# Patient Record
Sex: Female | Born: 1960 | Race: White | Hispanic: No | Marital: Married | Smoking: Never smoker
Health system: Southern US, Community
[De-identification: ages and names within clinical notes are randomized; demographics above are authoritative.]

## PROBLEM LIST (undated history)

## (undated) DIAGNOSIS — R635 Abnormal weight gain: Secondary | ICD-10-CM

## (undated) HISTORY — PX: AUGMENTATION MAMMAPLASTY: SUR837

## (undated) HISTORY — DX: Abnormal weight gain: R63.5

---

## 2013-03-19 ENCOUNTER — Ambulatory Visit: Payer: Self-pay | Admitting: Internal Medicine

## 2013-03-25 ENCOUNTER — Ambulatory Visit: Payer: Self-pay | Admitting: Internal Medicine

## 2013-03-26 ENCOUNTER — Ambulatory Visit: Payer: Self-pay | Admitting: Internal Medicine

## 2013-04-07 ENCOUNTER — Encounter: Payer: Self-pay | Admitting: Internal Medicine

## 2013-04-07 ENCOUNTER — Ambulatory Visit (INDEPENDENT_AMBULATORY_CARE_PROVIDER_SITE_OTHER): Payer: 59 | Admitting: Internal Medicine

## 2013-04-07 VITALS — BP 120/70 | HR 61 | Temp 97.0°F | Resp 18 | Wt 171.0 lb

## 2013-04-07 DIAGNOSIS — Z803 Family history of malignant neoplasm of breast: Secondary | ICD-10-CM | POA: Insufficient documentation

## 2013-04-07 DIAGNOSIS — J309 Allergic rhinitis, unspecified: Secondary | ICD-10-CM

## 2013-04-07 DIAGNOSIS — N951 Menopausal and female climacteric states: Secondary | ICD-10-CM

## 2013-04-07 DIAGNOSIS — Z780 Asymptomatic menopausal state: Secondary | ICD-10-CM | POA: Insufficient documentation

## 2013-04-07 DIAGNOSIS — Z139 Encounter for screening, unspecified: Secondary | ICD-10-CM

## 2013-04-07 NOTE — Progress Notes (Signed)
Subjective:    Patient ID: Katrina Taylor, female    DOB: 1961-02-16, 52 y.o.   MRN: [id number]  HPI  New pt here for first visit.  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    She formerly lived in [place].  She is newly married,   Has one 14 yo son.    PMH of allergic rhinitis.    She reports she is having daily hot flushes and wakes up multiple times during the night with flushes.  Her LMP was at age 52,  Tells me her mother stopped her menses at age 51 and her MGM stopped her menses at age 47.  She was never told that she may have premature menopause.    She had some very mild night sweats up to age 5 but much worse now waking her and daytime symptoms interfering with work  No weight loss no palpitations.  PH and FH  No gyn cancers.  Maunt had breast cancer,  No CVA,  No MI,  No personal or FH of DVT or PE  She is due for he r mm   No Known Allergies Past Medical History  Diagnosis Date  . Weight gain    Past Surgical History  Procedure Laterality Date  . Cesarean section     History   Social History  . Marital Status: Married    Spouse Name: N/A    Number of Children: N/A  . Years of Education: N/A   Occupational History  . Not on file.   Social History Main Topics  . Smoking status: Never Smoker   . Smokeless tobacco: Never Used  . Alcohol Use: No     Comment: ocassionaly  . Drug Use: No  . Sexual Activity: Yes    Birth Control/ Protection: None   Other Topics Concern  . Not on file   Social History Narrative  . No narrative on file   Family History  Problem Relation Age of Onset  . Cancer Mother 92    lung ca  . Hyperlipidemia Mother   . Alzheimer's disease Maternal Grandmother   . Hypertension Maternal Grandfather   . Cancer Paternal Grandfather     colon   Patient Active Problem List   Diagnosis Date Noted  . Menopause 04/07/2013  . Allergic rhinitis 04/07/2013  . Family history of breast cancer in female 04/07/2013    No current outpatient prescriptions on file prior to visit.   No current facility-administered medications on file prior to visit.      Review of Systems See HPI    Objective:   Physical Exam  Physical Exam  Nursing note and vitals reviewed.  Constitutional: She is oriented to person, place, and time. She appears well-developed and well-nourished.  HENT:  Head: Normocephalic and atraumatic.  Cardiovascular: Normal rate and regular rhythm. Exam reveals no gallop and no friction rub.  No murmur heard.  Pulmonary/Chest: Breath sounds normal. She has no wheezes. She has no rales.  Neurological: She is alert and oriented to person, place, and time.  Skin: Skin is warm and dry.  Psychiatric: She has a normal mood and affect. Her behavior is normal.         Assessment & Plan:  Vasomotor flushes  Will check free thyroid levels FSH.   [name] had premature menopause at age 22.  Counseled in great detail rgarding [facility] study and risk/benefit sheet given to pt.   Will get 3D mm and check all labs.  She is to see me next week to discuss  HT versus non HT options  Allergic rhinitis.  OTC Zyrted.    See me in one week

## 2013-04-07 NOTE — Patient Instructions (Addendum)
Get Chillow  Cooling pillow  Frogg Togg cooling towel

## 2013-04-08 LAB — LIPID PANEL
Cholesterol: 249 mg/dL — ABNORMAL HIGH (ref 0–200)
HDL: 83 mg/dL (ref >39)
LDL Cholesterol: 150 mg/dL — ABNORMAL HIGH (ref 0–99)
Triglycerides: 78 mg/dL (ref <150)

## 2013-04-08 LAB — COMPREHENSIVE METABOLIC PANEL
Albumin: 4.5 g/dL (ref 3.5–5.2)
Alkaline Phosphatase: 104 U/L (ref 39–117)
BUN: 20 mg/dL (ref 6–23)
CO2: 26 mEq/L (ref 19–32)
Calcium: 10.4 mg/dL (ref 8.4–10.5)
Glucose, Bld: 76 mg/dL (ref 70–99)
Potassium: 4.2 mEq/L (ref 3.5–5.3)

## 2013-04-08 LAB — CBC WITH DIFFERENTIAL/PLATELET
Basophils Relative: 1 % (ref 0–1)
HCT: 40.3 % (ref 36.0–46.0)
Hemoglobin: 13.6 g/dL (ref 12.0–15.0)
MCH: 31.3 pg (ref 26.0–34.0)
MCHC: 33.7 g/dL (ref 30.0–36.0)
Monocytes Absolute: 0.5 10*3/uL (ref 0.1–1.0)
Monocytes Relative: 7 % (ref 3–12)
Neutro Abs: 3.6 10*3/uL (ref 1.7–7.7)

## 2013-04-09 LAB — VITAMIN D 25 HYDROXY (VIT D DEFICIENCY, FRACTURES): Vit D, 25-Hydroxy: 33 ng/mL (ref 30–89)

## 2013-04-09 LAB — FOLLICLE STIMULATING HORMONE: FSH: 73.2 m[IU]/mL

## 2013-04-11 ENCOUNTER — Encounter: Payer: Self-pay | Admitting: Internal Medicine

## 2013-04-13 ENCOUNTER — Encounter: Payer: Self-pay | Admitting: *Deleted

## 2013-04-16 ENCOUNTER — Other Ambulatory Visit: Payer: Self-pay | Admitting: Internal Medicine

## 2013-04-16 ENCOUNTER — Ambulatory Visit: Admission: RE | Admit: 2013-04-16 | Payer: 59 | Source: Ambulatory Visit

## 2013-04-16 ENCOUNTER — Ambulatory Visit
Admission: RE | Admit: 2013-04-16 | Discharge: 2013-04-16 | Disposition: A | Discharge status: Home / Self Care | Payer: 59 | Source: Ambulatory Visit | Attending: Internal Medicine | Admitting: Internal Medicine

## 2013-04-16 DIAGNOSIS — Z139 Encounter for screening, unspecified: Secondary | ICD-10-CM

## 2013-04-16 DIAGNOSIS — Z1231 Encounter for screening mammogram for malignant neoplasm of breast: Secondary | ICD-10-CM

## 2013-04-28 ENCOUNTER — Telehealth: Payer: Self-pay | Admitting: *Deleted

## 2013-04-28 NOTE — Telephone Encounter (Signed)
Pt has had all labs and imaging completed pt is wanting to proceed with the minivelle.

## 2013-04-29 ENCOUNTER — Telehealth: Payer: Self-pay | Admitting: *Deleted

## 2013-04-29 ENCOUNTER — Other Ambulatory Visit: Payer: Self-pay | Admitting: Internal Medicine

## 2013-04-29 MED ORDER — ESTRADIOL 0.05 MG/24HR TD PTTW: MEDICATED_PATCH | TRANSDERMAL | Status: DC

## 2013-04-29 MED ORDER — PROGESTERONE MICRONIZED 100 MG PO CAPS: 100.0000 mg | ORAL_CAPSULE | Freq: Every day | ORAL | Status: DC

## 2013-04-29 NOTE — Telephone Encounter (Signed)
Notified pt that medication is at [facility] out pt pharmacy

## 2013-06-25 ENCOUNTER — Other Ambulatory Visit: Payer: Self-pay

## 2013-06-26 ENCOUNTER — Other Ambulatory Visit: Payer: Self-pay | Admitting: Internal Medicine

## 2013-06-26 NOTE — Telephone Encounter (Signed)
Refill request last MM 03/2013

## 2013-06-27 ENCOUNTER — Encounter: Payer: Self-pay | Admitting: Internal Medicine

## 2013-06-27 DIAGNOSIS — N951 Menopausal and female climacteric states: Secondary | ICD-10-CM | POA: Insufficient documentation

## 2013-06-27 NOTE — Telephone Encounter (Signed)
Katrina Taylor  Call pt and give her a 15 min appt to see me for follow up after starting her HT  Can see her before the end of the year.  I refilled 2 month of her minivelle so she has enough time to get to office  When you speak with her confirm that she is also taking her prometrium daily

## 2013-06-29 ENCOUNTER — Telehealth: Payer: Self-pay | Admitting: *Deleted

## 2013-06-29 NOTE — Telephone Encounter (Signed)
appt made for 12 /03  She is still taking the prometrium daily

## 2013-06-29 NOTE — Telephone Encounter (Signed)
error 

## 2013-07-02 ENCOUNTER — Other Ambulatory Visit: Payer: Self-pay | Admitting: Internal Medicine

## 2013-07-02 NOTE — Telephone Encounter (Signed)
Refill request MM 03/2013

## 2013-07-22 ENCOUNTER — Ambulatory Visit (INDEPENDENT_AMBULATORY_CARE_PROVIDER_SITE_OTHER): Payer: 59 | Admitting: Internal Medicine

## 2013-07-22 ENCOUNTER — Encounter: Payer: Self-pay | Admitting: Internal Medicine

## 2013-07-22 VITALS — BP 105/71 | HR 59 | Temp 97.4°F | Resp 18 | Wt 168.0 lb

## 2013-07-22 DIAGNOSIS — Z780 Asymptomatic menopausal state: Secondary | ICD-10-CM

## 2013-07-22 DIAGNOSIS — N951 Menopausal and female climacteric states: Secondary | ICD-10-CM

## 2013-07-22 DIAGNOSIS — S39012A Strain of muscle, fascia and tendon of lower back, initial encounter: Secondary | ICD-10-CM

## 2013-07-22 DIAGNOSIS — E785 Hyperlipidemia, unspecified: Secondary | ICD-10-CM

## 2013-07-22 DIAGNOSIS — S335XXA Sprain of ligaments of lumbar spine, initial encounter: Secondary | ICD-10-CM

## 2013-07-22 NOTE — Patient Instructions (Signed)
Schedule CPE

## 2013-07-22 NOTE — Progress Notes (Signed)
Subjective:    Patient ID: Katrina Taylor, female    DOB: March 24, 1961, 52 y.o.   MRN: [id number]  HPI Katrina Taylor is here for follow up after initiation of HT for bothersome menopausal flushes.  Feels much better no flushes on current dose of HT.  She denies vaginal spotting since initiation  See lipids  Katrina Taylor does not want any RX meds .  She has signed up for the [facility] program and has lost weight.    1 week ago while bending forward had acute L sided back pain when she tried to stand up.  Took ADvil intermittantly,  Had some pain in both thighs  (no swelling).  She tells me she is 100% better today.  No Known Allergies Past Medical History  Diagnosis Date  . Weight gain    Past Surgical History  Procedure Laterality Date  . Cesarean section     History   Social History  . Marital Status: Married    Spouse Name: N/A    Number of Children: N/A  . Years of Education: N/A   Occupational History  . Not on file.   Social History Main Topics  . Smoking status: Never Smoker   . Smokeless tobacco: Never Used  . Alcohol Use: No     Comment: ocassionaly  . Drug Use: No  . Sexual Activity: Yes    Birth Control/ Protection: None   Other Topics Concern  . Not on file   Social History Narrative  . No narrative on file   Family History  Problem Relation Age of Onset  . Cancer Mother 28    lung ca  . Hyperlipidemia Mother   . Alzheimer's disease Maternal Grandmother   . Hypertension Maternal Grandfather   . Cancer Paternal Grandfather     colon   Patient Active Problem List   Diagnosis Date Noted  . Menopausal hot flushes 06/27/2013  . Menopause 04/07/2013  . Allergic rhinitis 04/07/2013  . Family history of breast cancer in female 04/07/2013   Current Outpatient Prescriptions on File Prior to Visit  Medication Sig Dispense Refill  . cholecalciferol (VITAMIN D) 1000 UNITS tablet Take 1,000 Units by mouth daily.      . fish oil-omega-3 fatty acids 1000 MG  capsule Take 2 g by mouth daily.      Marland Kitchen MINIVELLE 0.05 MG/24HR patch PLACE ONE PATCH ON SKIN TWO TIMES WEEKLY  8 patch  1  . MULTIPLE MINERALS-VITAMINS PO Take 1 tablet by mouth daily.      . progesterone (PROMETRIUM) 100 MG capsule TAKE 1 CAPSULE BY MOUTH ONCE DAILY  90 capsule  0   No current facility-administered medications on file prior to visit.       Review of Systems    see HPI Objective:   Physical Exam  Physical Exam  Nursing note and vitals reviewed.  Constitutional: She is oriented to person, place, and time. She appears well-developed and well-nourished.  HENT:  Head: Normocephalic and atraumatic.  Cardiovascular: Normal rate and regular rhythm. Exam reveals no gallop and no friction rub.  No murmur heard.  Pulmonary/Chest: Breath sounds normal. She has no wheezes. She has no rales.  Neurological: She is alert and oriented to person, place, and time. LE  Reflexes 2+ symmetric   SLR neg bilaterally Motor 5/5 LE muscle groups bilaterally  Skin: Skin is warm and dry.  Psychiatric: She has a normal mood and affect. Her behavior is normal.  Assessment & Plan:  Symptomatic menopause  Will continue HT  Advised to notify me if any vaginal spotting or leg swelling.  We discussed trying a lower dose of estrogen when her next refill is due.  She wishes to stay on the same dose for now  L/S strain    Improved now  On no meds  Hyperlipidemia  Gave copy of dash diet .  She declines any medication  Will recheck fasting levels at CPE  Schedule CPe

## 2013-09-01 ENCOUNTER — Encounter: Payer: Self-pay | Admitting: Internal Medicine

## 2013-09-03 ENCOUNTER — Other Ambulatory Visit: Payer: Self-pay | Admitting: *Deleted

## 2013-09-03 MED ORDER — ESTRADIOL 0.05 MG/24HR TD PTTW: MEDICATED_PATCH | TRANSDERMAL | Status: DC

## 2013-09-03 MED ORDER — PROGESTERONE MICRONIZED 100 MG PO CAPS: ORAL_CAPSULE | ORAL | Status: DC

## 2013-09-03 NOTE — Telephone Encounter (Signed)
Refill request pt wants a 90 day supply

## 2013-09-22 ENCOUNTER — Encounter: Payer: 59 | Admitting: Internal Medicine

## 2013-11-05 ENCOUNTER — Other Ambulatory Visit: Payer: Self-pay | Admitting: *Deleted

## 2013-11-05 DIAGNOSIS — E785 Hyperlipidemia, unspecified: Secondary | ICD-10-CM

## 2013-11-10 ENCOUNTER — Encounter: Payer: 59 | Admitting: Internal Medicine

## 2013-12-08 ENCOUNTER — Other Ambulatory Visit: Payer: Self-pay | Admitting: *Deleted

## 2013-12-08 ENCOUNTER — Encounter: Payer: Self-pay | Admitting: Internal Medicine

## 2013-12-08 NOTE — Telephone Encounter (Signed)
Refill request wants 3 month supply

## 2013-12-09 MED ORDER — PROGESTERONE MICRONIZED 100 MG PO CAPS: ORAL_CAPSULE | ORAL | Status: DC

## 2013-12-09 MED ORDER — ESTRADIOL 0.05 MG/24HR TD PTTW: MEDICATED_PATCH | TRANSDERMAL | Status: DC

## 2013-12-11 ENCOUNTER — Encounter: Payer: Self-pay | Admitting: Internal Medicine

## 2013-12-11 ENCOUNTER — Other Ambulatory Visit: Payer: Self-pay | Admitting: *Deleted

## 2014-01-17 NOTE — Progress Notes (Signed)
Subjective:    Patient ID: Katrina Taylor, female    DOB: [birth year], 53 y.o.   MRN: [id number]  HPI  Katrina Taylor is here for CPE  She is on HT for perimenopausal flushes both day and night  She states 98% improvement .  No vaginal spotting    She participated in mud run for women over the weekend.      See lipids   She has started running,  Is losing weight and watching her diet    Allergic rhinitis  She began using Flonase and Zyrtec     No Known Allergies Past Medical History  Diagnosis Date  . Weight gain    Past Surgical History  Procedure Laterality Date  . Cesarean section     History   Social History  . Marital Status: Married    Spouse Name: N/A    Number of Children: N/A  . Years of Education: N/A   Occupational History  . Not on file.   Social History Main Topics  . Smoking status: Never Smoker   . Smokeless tobacco: Never Used  . Alcohol Use: No     Comment: ocassionaly  . Drug Use: No  . Sexual Activity: Yes    Birth Control/ Protection: None   Other Topics Concern  . Not on file   Social History Narrative  . No narrative on file   Family History  Problem Relation Age of Onset  . Cancer Mother [id number]    lung ca  . Hyperlipidemia Mother   . Alzheimer's disease Maternal Grandmother   . Hypertension Maternal Grandfather   . Cancer Paternal Grandfather     colon   Patient Active Problem List   Diagnosis Date Noted  . Hyperlipidemia 07/22/2013  . Menopausal hot flushes   initiated HT 04/2013 06/27/2013  . Menopause 04/07/2013  . Allergic rhinitis 04/07/2013  . Family history of breast cancer in female 04/07/2013   Current Outpatient Prescriptions on File Prior to Visit  Medication Sig Dispense Refill  . cholecalciferol (VITAMIN D) 1000 UNITS tablet Take 1,000 Units by mouth daily.      Marland Kitchen. estradiol (MINIVELLE) 0.05 MG/24HR patch Place one patch on skin two times per week  8 patch  2  . fish oil-omega-3 fatty acids 1000 MG capsule Take 2 g by  mouth daily.      . MULTIPLE MINERALS-VITAMINS PO Take 1 tablet by mouth daily.      . progesterone (PROMETRIUM) 100 MG capsule Take one daily  90 capsule  1   No current facility-administered medications on file prior to visit.      Review of Systems  Respiratory: Negative for cough, choking, chest tightness and shortness of breath.   Cardiovascular: Negative for chest pain, palpitations and leg swelling.  Gastrointestinal: Negative for abdominal pain.       Objective:   Physical Exam Physical Exam  Vital signs and nursing note reviewed  Constitutional: She is oriented to person, place, and time. She appears well-developed and well-nourished. She is cooperative.  HENT:  Head: Normocephalic and atraumatic.  Right Ear: Tympanic membrane normal.  Left Ear: Tympanic membrane normal.  Nose: Nose normal.  Mouth/Throat: Oropharynx is clear and moist and mucous membranes are normal. No oropharyngeal exudate or posterior oropharyngeal erythema.  Eyes: Conjunctivae and EOM are normal. Pupils are equal, round, and reactive to light.  Neck: Neck supple. No JVD present. Carotid bruit is not present. No mass and no thyromegaly present.  Cardiovascular: Regular rhythm,  normal heart sounds, intact distal pulses and normal pulses.  Exam reveals no gallop and no friction rub.   No murmur heard. Pulses:      Dorsalis pedis pulses are 2+ on the right side, and 2+ on the left side.  Pulmonary/Chest: Breath sounds normal. She has no wheezes. She has no rhonchi. She has no rales. Right breast exhibits no mass, no nipple discharge and no skin change. Left breast exhibits no mass, no nipple discharge and no skin change.   She has bilateral breast implants Abdominal: Soft. Bowel sounds are normal. She exhibits no distension and no mass. There is no hepatosplenomegaly. There is no tenderness. There is no CVA tenderness.   Rectal no mass guaiac neg Genitourinary: Rectum normal, vagina normal and uterus  normal. Rectal exam shows no mass. Guaiac negative stool. No labial fusion. There is no lesion on the right labia. There is no lesion on the left labia. Cervix exhibits no motion tenderness. Right adnexum displays no mass, no tenderness and no fullness. Left adnexum displays no mass, no tenderness and no fullness. No erythema around the vagina.  Musculoskeletal:       No active synovitis to any joint.    Lymphadenopathy:       Right cervical: No superficial cervical adenopathy present.      Left cervical: No superficial cervical adenopathy present.       Right axillary: No pectoral and no lateral adenopathy present.       Left axillary: No pectoral and no lateral adenopathy present.      Right: No inguinal adenopathy present.       Left: No inguinal adenopathy present.  Neurological: She is alert and oriented to person, place, and time. She has normal strength and normal reflexes. No cranial nerve deficit or sensory deficit. She displays a negative Romberg sign. Coordination and gait normal.  Skin: Skin is warm and dry. No abrasion, no bruising, no ecchymosis and no rash noted. No cyanosis. Nails show no clubbing.  Psychiatric: She has a normal mood and affect. Her speech is normal and behavior is normal.          Assessment & Plan:  Health Maintenance:  Will schedule  Colonoscopy.  MM due in 03/2014  Counseled lung cancer screening guideline  She is a non-smoker  Pap today  Hyperlipdemia  Will rehceck today as pt is fasting  Vasomotor flushes    Much improved.    ADvised to be sure to get yearly mm.  M  aunt has breast cancer  I advised pt is she wants to taper at 32 month of use  That is OK  Allergic rhinitis  conitnue meds    See me in 6 months to one year        Assessment & Plan:

## 2014-01-18 ENCOUNTER — Ambulatory Visit (INDEPENDENT_AMBULATORY_CARE_PROVIDER_SITE_OTHER): Payer: 59 | Admitting: Internal Medicine

## 2014-01-18 ENCOUNTER — Encounter: Payer: Self-pay | Admitting: Internal Medicine

## 2014-01-18 VITALS — BP 120/73 | HR 67 | Temp 98.1°F | Resp 18 | Ht 67.5 in | Wt 163.0 lb

## 2014-01-18 DIAGNOSIS — Z1151 Encounter for screening for human papillomavirus (HPV): Secondary | ICD-10-CM

## 2014-01-18 DIAGNOSIS — E785 Hyperlipidemia, unspecified: Secondary | ICD-10-CM

## 2014-01-18 DIAGNOSIS — N951 Menopausal and female climacteric states: Secondary | ICD-10-CM

## 2014-01-18 DIAGNOSIS — Z1211 Encounter for screening for malignant neoplasm of colon: Secondary | ICD-10-CM

## 2014-01-18 DIAGNOSIS — Z0000 Encounter for general adult medical examination without abnormal findings: Secondary | ICD-10-CM

## 2014-01-18 DIAGNOSIS — J309 Allergic rhinitis, unspecified: Secondary | ICD-10-CM

## 2014-01-18 DIAGNOSIS — Z124 Encounter for screening for malignant neoplasm of cervix: Secondary | ICD-10-CM

## 2014-01-18 LAB — POCT URINALYSIS DIPSTICK
BILIRUBIN UA: NEGATIVE
Blood, UA: NEGATIVE
Glucose, UA: NEGATIVE
KETONES UA: NEGATIVE
Leukocytes, UA: NEGATIVE
Nitrite, UA: NEGATIVE
PROTEIN UA: NEGATIVE
Spec Grav, UA: 1.01
Urobilinogen, UA: NEGATIVE
pH, UA: 5.5

## 2014-01-18 NOTE — Patient Instructions (Signed)
Set up GI referral to Dr. Juanda Chance or Dr. Russella Dar   To lab today  See me one year  CPE

## 2014-01-18 NOTE — Addendum Note (Signed)
Addended by: Mathews Robinsons on: 01/18/2014 10:40 AM   Modules accepted: Orders

## 2014-01-19 ENCOUNTER — Encounter: Payer: Self-pay | Admitting: Internal Medicine

## 2014-01-19 LAB — LIPID PANEL
Cholesterol: 214 mg/dL — ABNORMAL HIGH (ref 0–200)
HDL: 74 mg/dL (ref >39)
LDL CALC: 123 mg/dL — AB (ref 0–99)
Total CHOL/HDL Ratio: 2.9 Ratio
Triglycerides: 83 mg/dL (ref <150)
VLDL: 17 mg/dL (ref 0–40)

## 2014-01-19 LAB — COMPREHENSIVE METABOLIC PANEL
ALBUMIN: 4.3 g/dL (ref 3.5–5.2)
ALK PHOS: 75 U/L (ref 39–117)
ALT: 18 U/L (ref 0–35)
AST: 30 U/L (ref 0–37)
BUN: 19 mg/dL (ref 6–23)
CALCIUM: 10 mg/dL (ref 8.4–10.5)
CHLORIDE: 103 meq/L (ref 96–112)
CO2: 25 mEq/L (ref 19–32)
Creat: 0.8 mg/dL (ref 0.50–1.10)
Glucose, Bld: 89 mg/dL (ref 70–99)
POTASSIUM: 4.6 meq/L (ref 3.5–5.3)
SODIUM: 139 meq/L (ref 135–145)
TOTAL PROTEIN: 6.7 g/dL (ref 6.0–8.3)
Total Bilirubin: 0.6 mg/dL (ref 0.2–1.2)

## 2014-01-19 LAB — CBC WITH DIFFERENTIAL/PLATELET
BASOS ABS: 0 10*3/uL (ref 0.0–0.1)
BASOS PCT: 0 % (ref 0–1)
EOS ABS: 0.3 10*3/uL (ref 0.0–0.7)
Eosinophils Relative: 5 % (ref 0–5)
HCT: 39.8 % (ref 36.0–46.0)
Hemoglobin: 13.6 g/dL (ref 12.0–15.0)
Lymphocytes Relative: 39 % (ref 12–46)
Lymphs Abs: 2.6 10*3/uL (ref 0.7–4.0)
MCH: 32.2 pg (ref 26.0–34.0)
MCHC: 34.2 g/dL (ref 30.0–36.0)
MCV: 94.1 fL (ref 78.0–100.0)
MONOS PCT: 8 % (ref 3–12)
Monocytes Absolute: 0.5 10*3/uL (ref 0.1–1.0)
NEUTROS ABS: 3.2 10*3/uL (ref 1.7–7.7)
NEUTROS PCT: 48 % (ref 43–77)
PLATELETS: 334 10*3/uL (ref 150–400)
RBC: 4.23 MIL/uL (ref 3.87–5.11)
RDW: 13.3 % (ref 11.5–15.5)
WBC: 6.7 10*3/uL (ref 4.0–10.5)

## 2014-01-19 LAB — VITAMIN D 25 HYDROXY (VIT D DEFICIENCY, FRACTURES): Vit D, 25-Hydroxy: 25 ng/mL — ABNORMAL LOW (ref 30–89)

## 2014-01-19 LAB — TSH: TSH: 0.946 u[IU]/mL (ref 0.350–4.500)

## 2014-02-23 ENCOUNTER — Encounter: Payer: Self-pay | Admitting: Internal Medicine

## 2014-03-25 ENCOUNTER — Other Ambulatory Visit: Payer: Self-pay | Admitting: *Deleted

## 2014-03-25 MED ORDER — PROGESTERONE MICRONIZED 100 MG PO CAPS: ORAL_CAPSULE | ORAL | Status: DC

## 2014-03-25 MED ORDER — ESTRADIOL 0.05 MG/24HR TD PTTW: MEDICATED_PATCH | TRANSDERMAL | Status: DC

## 2014-03-25 NOTE — Telephone Encounter (Signed)
Pt called in requesting refill

## 2014-06-21 ENCOUNTER — Encounter: Payer: Self-pay | Admitting: Internal Medicine

## 2014-07-12 ENCOUNTER — Other Ambulatory Visit: Payer: Self-pay | Admitting: *Deleted

## 2014-07-12 MED ORDER — ESTRADIOL 0.05 MG/24HR TD PTTW: MEDICATED_PATCH | TRANSDERMAL | Status: DC

## 2014-07-12 NOTE — Telephone Encounter (Signed)
I left Aram BeechamCynthia a message to call the office in regards to her refill-eh

## 2014-07-12 NOTE — Telephone Encounter (Signed)
Refill request

## 2014-07-12 NOTE — Telephone Encounter (Signed)
Call pt and let her know that i refilled on ly 30 days of her estrogen.  She is overdue for her mm.   When I get mm report I can re-order more minivelle

## 2014-07-13 ENCOUNTER — Other Ambulatory Visit: Payer: Self-pay

## 2014-07-13 DIAGNOSIS — Z1231 Encounter for screening mammogram for malignant neoplasm of breast: Secondary | ICD-10-CM

## 2014-07-22 ENCOUNTER — Other Ambulatory Visit: Payer: Self-pay | Admitting: *Deleted

## 2014-07-22 ENCOUNTER — Encounter: Payer: Self-pay | Admitting: Internal Medicine

## 2014-07-22 MED ORDER — PROGESTERONE MICRONIZED 100 MG PO CAPS: ORAL_CAPSULE | ORAL | Status: DC

## 2014-07-22 NOTE — Telephone Encounter (Signed)
If not done already let pt know I can order more hormones when I get her mm report  It was due in September

## 2014-07-22 NOTE — Telephone Encounter (Signed)
Refill request

## 2014-07-22 NOTE — Telephone Encounter (Signed)
She is scheduled for December for her mammogram

## 2014-08-10 ENCOUNTER — Ambulatory Visit
Admission: RE | Admit: 2014-08-10 | Discharge: 2014-08-10 | Disposition: A | Discharge status: Home / Self Care | Payer: 59 | Source: Ambulatory Visit

## 2014-08-10 ENCOUNTER — Other Ambulatory Visit: Payer: Self-pay | Admitting: *Deleted

## 2014-08-10 DIAGNOSIS — Z1231 Encounter for screening mammogram for malignant neoplasm of breast: Secondary | ICD-10-CM

## 2014-08-10 NOTE — Telephone Encounter (Signed)
Patient had MM done today. She is leaving to go out of town this week. She is requesting a 3 month supply-eh

## 2014-08-11 MED ORDER — ESTRADIOL 0.05 MG/24HR TD PTTW: MEDICATED_PATCH | TRANSDERMAL | Status: DC

## 2014-09-02 ENCOUNTER — Other Ambulatory Visit: Payer: Self-pay | Admitting: Internal Medicine

## 2014-09-02 NOTE — Telephone Encounter (Signed)
Refill request

## 2014-09-05 MED ORDER — PROGESTERONE MICRONIZED 100 MG PO CAPS: ORAL_CAPSULE | ORAL | Status: DC

## 2014-11-23 ENCOUNTER — Other Ambulatory Visit: Payer: Self-pay | Admitting: *Deleted

## 2014-11-23 MED ORDER — ESTRADIOL 0.05 MG/24HR TD PTTW: MEDICATED_PATCH | TRANSDERMAL | Status: DC

## 2014-11-23 NOTE — Telephone Encounter (Signed)
Refill request

## 2014-12-07 NOTE — Telephone Encounter (Signed)
Pt called in stating she had requested a 90 day supply of the Minivelle and Progesterone. Only 30day supply of Minivelle was sent to pharmacy. Please send in 90 day supply of each medication per pt request.

## 2014-12-07 NOTE — Addendum Note (Signed)
Addended by: Arne ClevelandHUTCHINSON, Cherish Runde J on: 12/07/2014 11:29 AM   Modules accepted: Orders

## 2014-12-08 MED ORDER — ESTRADIOL 0.05 MG/24HR TD PTTW: MEDICATED_PATCH | TRANSDERMAL | Status: AC

## 2014-12-08 MED ORDER — PROGESTERONE MICRONIZED 100 MG PO CAPS: ORAL_CAPSULE | ORAL | Status: AC

## 2014-12-08 NOTE — Addendum Note (Signed)
Addended by: Raechel ChuteSCHOENHOFF, DEBORAH D on: 12/08/2014 07:36 AM   Modules accepted: Orders

## 2015-04-27 ENCOUNTER — Telehealth: Payer: Self-pay | Admitting: Family Medicine

## 2015-04-27 NOTE — Telephone Encounter (Signed)
Ok to establish but pt needs visit for med refill

## 2015-04-27 NOTE — Telephone Encounter (Signed)
Left msg for pt to call and schedule appt for med refills with Dr. Beverely Low (new pt)

## 2015-04-27 NOTE — Telephone Encounter (Signed)
Pt was seeing Dr. Constance Goltz and wants to change to Dr. Beverely Low. Advised her that Dr. Beverely Low is moving to [facility] and she said that would be closer to her. I scheduled her but advised it needs approved and may be cancelled. Pt states she will need some med refills that are almost out. If ok to accept as new pt should I schedule an acute for med refills now?

## 2015-07-06 ENCOUNTER — Other Ambulatory Visit: Payer: Self-pay

## 2015-07-06 DIAGNOSIS — Z1231 Encounter for screening mammogram for malignant neoplasm of breast: Secondary | ICD-10-CM

## 2015-08-08 ENCOUNTER — Ambulatory Visit (INDEPENDENT_AMBULATORY_CARE_PROVIDER_SITE_OTHER): Payer: 59 | Admitting: Family Medicine

## 2015-08-08 ENCOUNTER — Encounter: Payer: Self-pay | Admitting: Family Medicine

## 2015-08-08 VITALS — BP 124/82 | HR 73 | Temp 98.0°F | Resp 16 | Ht 66.0 in | Wt 182.2 lb

## 2015-08-08 DIAGNOSIS — Z0000 Encounter for general adult medical examination without abnormal findings: Secondary | ICD-10-CM | POA: Diagnosis not present

## 2015-08-08 DIAGNOSIS — Z1211 Encounter for screening for malignant neoplasm of colon: Secondary | ICD-10-CM | POA: Diagnosis not present

## 2015-08-08 NOTE — Assessment & Plan Note (Signed)
Pt's PE WNL w/ exception of being overweight.  Stressed need for healthy diet and regular exercise.  UTD on flu shot and Tdap (per report).  mammo scheduled.  Pap UTD.  Due for colonoscopy- referral entered.  Reviewed HRT regimen w/ pt- will continue at this time.  Check labs.  Anticipatory guidance provided.

## 2015-08-08 NOTE — Progress Notes (Signed)
   Subjective:    Patient ID: Katrina Taylor, female    DOB: 04/13/1961, 54 y.o.   MRN: [id number]  HPI New to establish.  Previous MD- Schoenhoff.  Has mammo scheduled for next week.  UTD on pap smear (2015).  Due for colonoscopy.  Pt is Probation officerxecutive Director of Orthopedics.  Pt is exercising 3-4x/week.   Review of Systems Patient reports no vision/ hearing changes, adenopathy,fever,  persistant/recurrent hoarseness , swallowing issues, chest pain, palpitations, edema, persistant/recurrent cough, hemoptysis, dyspnea (rest/exertional/paroxysmal nocturnal), gastrointestinal bleeding (melena, rectal bleeding), abdominal pain, significant heartburn, bowel changes, GU symptoms (dysuria, hematuria, incontinence), Gyn symptoms (abnormal  bleeding, pain),  syncope, focal weakness, memory loss, numbness & tingling, skin/hair/nail changes, abnormal bruising or bleeding, anxiety, or depression.   + weight gain- 20 lbs in 18 months    Objective:   Physical Exam General Appearance:    Alert, cooperative, no distress, appears stated age  Head:    Normocephalic, without obvious abnormality, atraumatic  Eyes:    PERRL, conjunctiva/corneas clear, EOM's intact, fundi    benign, both eyes  Ears:    Normal TM's and external ear canals, both ears  Nose:   Nares normal, septum midline, mucosa normal, no drainage    or sinus tenderness  Throat:   Lips, mucosa, and tongue normal; teeth and gums normal  Neck:   Supple, symmetrical, trachea midline, no adenopathy;    Thyroid: no enlargement/tenderness/nodules  Back:     Symmetric, no curvature, ROM normal, no CVA tenderness  Lungs:     Clear to auscultation bilaterally, respirations unlabored  Chest Wall:    No tenderness or deformity   Heart:    Regular rate and rhythm, S1 and S2 normal, no murmur, rub   or gallop  Breast Exam:    Deferred to GYN  Abdomen:     Soft, non-tender, bowel sounds active all four quadrants,    no masses, no organomegaly  Genitalia:     Deferred to GYN  Rectal:    Extremities:   Extremities normal, atraumatic, no cyanosis or edema  Pulses:   2+ and symmetric all extremities  Skin:   Skin color, texture, turgor normal, no rashes or lesions  Lymph nodes:   Cervical, supraclavicular, and axillary nodes normal  Neurologic:   CNII-XII intact, normal strength, sensation and reflexes    throughout          Assessment & Plan:

## 2015-08-08 NOTE — Progress Notes (Signed)
Pre visit review using our clinic review tool, if applicable. No additional management support is needed unless otherwise documented below in the visit note. 

## 2015-08-08 NOTE — Patient Instructions (Signed)
Follow up in 6 months to recheck weight and cholesterol We'll notify you of your lab results and make any changes if needed Continue to work on healthy diet and regular exercise- you can do it!!! If no improvement in the hot flashes after the holiday season, let me know so we can make adjustments Call with any questions or concerns If you want to join us at the new [place] office, any scheduled appointments will automatically transfer and we will see you at [address], [place], [place] [id number] (OPENING 08/23/15) Happy Holidays!!!

## 2015-08-09 LAB — CBC WITH DIFFERENTIAL/PLATELET
BASOS ABS: 0.1 10*3/uL (ref 0.0–0.1)
Basophils Relative: 0.7 % (ref 0.0–3.0)
EOS ABS: 0.4 10*3/uL (ref 0.0–0.7)
Eosinophils Relative: 4 % (ref 0.0–5.0)
HCT: 40.3 % (ref 36.0–46.0)
Hemoglobin: 13.5 g/dL (ref 12.0–15.0)
LYMPHS ABS: 3.6 10*3/uL (ref 0.7–4.0)
Lymphocytes Relative: 38.9 % (ref 12.0–46.0)
MCHC: 33.5 g/dL (ref 30.0–36.0)
MCV: 94.6 fl (ref 78.0–100.0)
MONO ABS: 0.7 10*3/uL (ref 0.1–1.0)
MONOS PCT: 7.7 % (ref 3.0–12.0)
NEUTROS ABS: 4.5 10*3/uL (ref 1.4–7.7)
NEUTROS PCT: 48.7 % (ref 43.0–77.0)
PLATELETS: 346 10*3/uL (ref 150.0–400.0)
RBC: 4.25 Mil/uL (ref 3.87–5.11)
RDW: 12.9 % (ref 11.5–15.5)
WBC: 9.2 10*3/uL (ref 4.0–10.5)

## 2015-08-09 LAB — HEPATIC FUNCTION PANEL
ALBUMIN: 4.1 g/dL (ref 3.5–5.2)
ALK PHOS: 68 U/L (ref 39–117)
ALT: 43 U/L — AB (ref 0–35)
AST: 40 U/L — AB (ref 0–37)
Bilirubin, Direct: 0.1 mg/dL (ref 0.0–0.3)
TOTAL PROTEIN: 6.9 g/dL (ref 6.0–8.3)
Total Bilirubin: 0.4 mg/dL (ref 0.2–1.2)

## 2015-08-09 LAB — LIPID PANEL
CHOL/HDL RATIO: 4
CHOLESTEROL: 255 mg/dL — AB (ref 0–200)
HDL: 71.2 mg/dL (ref >39.00)
LDL CALC: 158 mg/dL — AB (ref 0–99)
NonHDL: 183.49
Triglycerides: 128 mg/dL (ref 0.0–149.0)
VLDL: 25.6 mg/dL (ref 0.0–40.0)

## 2015-08-09 LAB — BASIC METABOLIC PANEL
BUN: 18 mg/dL (ref 6–23)
CO2: 26 mEq/L (ref 19–32)
Calcium: 9.7 mg/dL (ref 8.4–10.5)
Chloride: 105 mEq/L (ref 96–112)
Creatinine, Ser: 0.82 mg/dL (ref 0.40–1.20)
GFR: 76.93 mL/min (ref >60.00)
Glucose, Bld: 79 mg/dL (ref 70–99)
POTASSIUM: 3.9 meq/L (ref 3.5–5.1)
Sodium: 138 mEq/L (ref 135–145)

## 2015-08-09 LAB — TSH: TSH: 1.35 u[IU]/mL (ref 0.35–4.50)

## 2015-08-09 LAB — VITAMIN D 25 HYDROXY (VIT D DEFICIENCY, FRACTURES): VITD: 19.24 ng/mL — AB (ref 30.00–100.00)

## 2015-08-12 ENCOUNTER — Other Ambulatory Visit: Payer: Self-pay | Admitting: General Practice

## 2015-08-12 DIAGNOSIS — R945 Abnormal results of liver function studies: Secondary | ICD-10-CM

## 2015-08-12 DIAGNOSIS — R7989 Other specified abnormal findings of blood chemistry: Secondary | ICD-10-CM

## 2015-08-12 MED ORDER — VITAMIN D (ERGOCALCIFEROL) 1.25 MG (50000 UNIT) PO CAPS: 50000.0000 [IU] | ORAL_CAPSULE | ORAL | Status: DC

## 2015-08-16 ENCOUNTER — Ambulatory Visit
Admission: RE | Admit: 2015-08-16 | Discharge: 2015-08-16 | Disposition: A | Discharge status: Home / Self Care | Payer: 59 | Source: Ambulatory Visit

## 2015-08-16 ENCOUNTER — Encounter: Payer: Self-pay | Admitting: Family Medicine

## 2015-08-16 DIAGNOSIS — Z1231 Encounter for screening mammogram for malignant neoplasm of breast: Secondary | ICD-10-CM

## 2015-08-17 ENCOUNTER — Other Ambulatory Visit: Payer: Self-pay | Admitting: Family Medicine

## 2015-08-17 DIAGNOSIS — R928 Other abnormal and inconclusive findings on diagnostic imaging of breast: Secondary | ICD-10-CM

## 2015-08-24 DIAGNOSIS — E784 Other hyperlipidemia: Secondary | ICD-10-CM | POA: Diagnosis not present

## 2015-08-24 DIAGNOSIS — R928 Other abnormal and inconclusive findings on diagnostic imaging of breast: Secondary | ICD-10-CM | POA: Diagnosis not present

## 2015-08-24 DIAGNOSIS — N951 Menopausal and female climacteric states: Secondary | ICD-10-CM | POA: Diagnosis not present

## 2015-08-24 DIAGNOSIS — R740 Nonspecific elevation of levels of transaminase and lactic acid dehydrogenase [LDH]: Secondary | ICD-10-CM | POA: Diagnosis not present

## 2015-08-26 ENCOUNTER — Ambulatory Visit
Admission: RE | Admit: 2015-08-26 | Discharge: 2015-08-26 | Disposition: A | Discharge status: Home / Self Care | Payer: 59 | Source: Ambulatory Visit | Attending: Family Medicine | Admitting: Family Medicine

## 2015-08-26 DIAGNOSIS — N6489 Other specified disorders of breast: Secondary | ICD-10-CM | POA: Diagnosis not present

## 2015-08-26 DIAGNOSIS — R928 Other abnormal and inconclusive findings on diagnostic imaging of breast: Secondary | ICD-10-CM

## 2015-08-30 ENCOUNTER — Other Ambulatory Visit: Payer: Self-pay | Admitting: Family Medicine

## 2015-09-09 ENCOUNTER — Encounter: Payer: Self-pay | Admitting: Gastroenterology

## 2015-09-14 MED FILL — PROGESTERONE 100 MG CAPSULE: 100 | 90 days supply | Qty: 90 | Fill #0

## 2015-09-14 MED FILL — MINIVELLE 0.025 MG PATCH: 0.025 | 28 days supply | Qty: 8 | Fill #0

## 2015-09-20 DIAGNOSIS — H10029 Other mucopurulent conjunctivitis, unspecified eye: Secondary | ICD-10-CM | POA: Diagnosis not present

## 2015-09-20 MED FILL — CIPROFLOXACIN 0.3% EYE DRO: 0.3 | 13 days supply | Qty: 5 | Fill #0

## 2015-09-20 MED FILL — AMOXICILLIN 500 MG CAPSULE: 500 | 7 days supply | Qty: 32 | Fill #0

## 2015-10-24 MED FILL — MINIVELLE 0.025 MG PATCH: 0.025 | 27 days supply | Qty: 8 | Fill #0

## 2015-11-28 MED FILL — MINIVELLE 0.025 MG PATCH: 0.025 | 28 days supply | Qty: 8 | Fill #1

## 2015-12-27 MED FILL — PROGESTERONE 100 MG CAPSULE: 100 | 90 days supply | Qty: 90 | Fill #1

## 2015-12-29 MED FILL — MINIVELLE 0.025 MG PATCH: 0.025 | 28 days supply | Qty: 8 | Fill #2

## 2016-01-20 ENCOUNTER — Ambulatory Visit: Payer: 59 | Admitting: Family Medicine

## 2016-01-24 MED FILL — MINIVELLE 0.025 MG PATCH: 0.025 | 28 days supply | Qty: 8 | Fill #3

## 2016-02-29 MED FILL — MINIVELLE 0.025 MG PATCH: 0.025 | 28 days supply | Qty: 8 | Fill #4

## 2016-03-09 MED FILL — PHENTERMINE 37.5 MG TABLET: 37.5 | 30 days supply | Qty: 30 | Fill #0

## 2016-04-05 MED FILL — MINIVELLE 0.025 MG PATCH: 0.025 | 28 days supply | Qty: 8 | Fill #5

## 2016-04-13 MED FILL — PROGESTERONE 100 MG CAPSULE: 100 | 90 days supply | Qty: 90 | Fill #2

## 2016-04-13 MED FILL — PHENTERMINE 37.5 MG TABLET: 37.5 | 30 days supply | Qty: 30 | Fill #0

## 2016-04-25 ENCOUNTER — Other Ambulatory Visit: Payer: Self-pay | Admitting: Internal Medicine

## 2016-04-25 DIAGNOSIS — Z1231 Encounter for screening mammogram for malignant neoplasm of breast: Secondary | ICD-10-CM

## 2016-05-14 MED FILL — MINIVELLE 0.025 MG PATCH: 0.025 | 28 days supply | Qty: 8 | Fill #6

## 2016-06-13 MED FILL — MINIVELLE 0.025 MG PATCH: 0.025 | 28 days supply | Qty: 8 | Fill #7

## 2016-07-06 MED FILL — MINIVELLE 0.025 MG PATCH: 0.025 | 28 days supply | Qty: 8 | Fill #8

## 2016-07-09 MED FILL — PROGESTERONE 100 MG CAPSULE: 100 | 90 days supply | Qty: 90 | Fill #0

## 2016-08-16 ENCOUNTER — Ambulatory Visit
Admission: RE | Admit: 2016-08-16 | Discharge: 2016-08-16 | Disposition: A | Discharge status: Home / Self Care | Payer: 59 | Source: Ambulatory Visit | Attending: Internal Medicine | Admitting: Internal Medicine

## 2016-08-16 DIAGNOSIS — Z1231 Encounter for screening mammogram for malignant neoplasm of breast: Secondary | ICD-10-CM | POA: Diagnosis not present

## 2016-08-21 MED FILL — MINIVELLE 0.025 MG PATCH: 0.025 | 28 days supply | Qty: 8 | Fill #9

## 2016-08-23 MED FILL — PHENTERMINE 37.5 MG TABLET: 37.5 | 30 days supply | Qty: 45 | Fill #0

## 2016-08-28 ENCOUNTER — Other Ambulatory Visit: Payer: Self-pay | Admitting: Internal Medicine

## 2016-08-28 ENCOUNTER — Other Ambulatory Visit (HOSPITAL_COMMUNITY)
Admission: RE | Admit: 2016-08-28 | Discharge: 2016-08-28 | Disposition: A | Discharge status: Home / Self Care | Payer: 59 | Source: Ambulatory Visit | Attending: Internal Medicine | Admitting: Internal Medicine

## 2016-08-28 DIAGNOSIS — E7800 Pure hypercholesterolemia, unspecified: Secondary | ICD-10-CM | POA: Diagnosis not present

## 2016-08-28 DIAGNOSIS — Z01419 Encounter for gynecological examination (general) (routine) without abnormal findings: Secondary | ICD-10-CM | POA: Insufficient documentation

## 2016-08-28 DIAGNOSIS — Z23 Encounter for immunization: Secondary | ICD-10-CM | POA: Diagnosis not present

## 2016-08-28 DIAGNOSIS — N951 Menopausal and female climacteric states: Secondary | ICD-10-CM | POA: Diagnosis not present

## 2016-08-28 DIAGNOSIS — Z0000 Encounter for general adult medical examination without abnormal findings: Secondary | ICD-10-CM | POA: Diagnosis not present

## 2016-08-28 DIAGNOSIS — E559 Vitamin D deficiency, unspecified: Secondary | ICD-10-CM | POA: Diagnosis not present

## 2016-08-28 MED FILL — ESTRADIOL 0.05 MG PATCH: 0.05 | 84 days supply | Qty: 24 | Fill #0

## 2016-08-29 LAB — CYTOLOGY - PAP
Adequacy: ABSENT
Diagnosis: NEGATIVE

## 2016-10-30 MED FILL — PROGESTERONE 100 MG CAPSULE: 100 | 90 days supply | Qty: 90 | Fill #0

## 2016-11-26 MED FILL — ESTRADIOL 0.05 MG PATCH: 0.05 | 84 days supply | Qty: 24 | Fill #1

## 2016-11-26 MED FILL — PHENTERMINE 37.5 MG TABLET: 37.5 | 30 days supply | Qty: 30 | Fill #0

## 2016-11-27 DIAGNOSIS — E7800 Pure hypercholesterolemia, unspecified: Secondary | ICD-10-CM | POA: Diagnosis not present

## 2016-11-29 DIAGNOSIS — E7800 Pure hypercholesterolemia, unspecified: Secondary | ICD-10-CM | POA: Diagnosis not present

## 2016-11-29 DIAGNOSIS — N951 Menopausal and female climacteric states: Secondary | ICD-10-CM | POA: Diagnosis not present

## 2017-02-06 MED FILL — PHENTERMINE 37.5 MG TABLET: 37.5 | 30 days supply | Qty: 30 | Fill #0

## 2017-03-11 MED FILL — PROGESTERONE 100 MG CAPSULE: 100 | 90 days supply | Qty: 90 | Fill #1

## 2017-03-26 MED FILL — ESTRADIOL 0.05 MG PATCH: 0.05 | 84 days supply | Qty: 24 | Fill #0

## 2017-04-11 MED FILL — PHENTERMINE 37.5 MG TABLET: 37.5 | 30 days supply | Qty: 30 | Fill #0

## 2017-06-17 ENCOUNTER — Ambulatory Visit: Payer: PRIVATE HEALTH INSURANCE | Attending: Family Medicine

## 2017-06-17 DIAGNOSIS — R252 Cramp and spasm: Secondary | ICD-10-CM | POA: Diagnosis present

## 2017-06-17 DIAGNOSIS — M25612 Stiffness of left shoulder, not elsewhere classified: Secondary | ICD-10-CM | POA: Insufficient documentation

## 2017-06-17 DIAGNOSIS — M25512 Pain in left shoulder: Secondary | ICD-10-CM | POA: Insufficient documentation

## 2017-06-17 MED FILL — PHENTERMINE 37.5 MG TABLET: 37.5 | 30 days supply | Qty: 30 | Fill #0

## 2017-06-17 NOTE — Therapy (Signed)
[facility] [address] [place], [place], [address] Phone: [phone]   Fax:  [phone]  Physical Therapy Evaluation  Patient Details  Name: Katrina PerfectCynthia M Taylor MRN: [id number] Date of Birth: 02-29-1961 Referring Provider: Lu Duffelhomas Kinsley MD  Encounter Date: 06/17/2017      PT End of Session - 06/17/17 0942    Visit Number 1   Number of Visits 7   Date for PT Re-Evaluation 07/12/17   Authorization Type MC WC   PT Start Time 0847   PT Stop Time 0935   PT Time Calculation (min) 48 min   Activity Tolerance Patient tolerated treatment well   Behavior During Therapy [facility] for tasks assessed/performed      Past Medical History:  Diagnosis Date  . Weight gain     Past Surgical History:  Procedure Laterality Date  . CESAREAN SECTION      There were no vitals filed for this visit.       Subjective Assessment - 06/17/17 0902    Subjective She reports Lt shoulder pain  in upper arm with flu shot.   May have been put in joint per patient.    Improved 30% with exercises but pain with sleep and reaching over head and across body   Limitations Lifting;House hold activities   How long can you sit comfortably? NA   How long can you stand comfortably? Na   How long can you walk comfortably? NA   Diagnostic tests No testing   Patient Stated Goals She wants to have pain eliminated with full ROM   Currently in Pain? No/denies   Pain Score --  with lying down and using arm   Pain Location Shoulder   Pain Orientation Left   Pain Type Acute pain   Pain Radiating Towards Lt neck. Also reported tingling with palpation RT upper arm after ROm   Pain Onset 1 to 4 weeks ago   Pain Frequency Intermittent   Aggravating Factors  using arm, lying on LT side   Pain Relieving Factors Heat / cold/ advil   Multiple Pain Sites No            [facility] PT Assessment - 06/17/17 0001      Assessment   Medical Diagnosis myalgia   Referring  Provider Lu Duffelhomas Kinsley MD   Onset Date/Surgical Date --  4 weeks ago   Hand Dominance Right   Next MD Visit end of next month   Prior Therapy No     Precautions   Precautions None   Precaution Comments do what you can     Restrictions   Weight Bearing Restrictions No     Balance Screen   Has the patient fallen in the past 6 months No   Has the patient had a decrease in activity level because of a fear of falling?  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      Cognition   Overall Cognitive Status Within Functional Limits for tasks assessed     Observation/Other Assessments   Focus on Therapeutic Outcomes (FOTO)  70% limited     ROM / Strength   AROM / PROM / Strength AROM;Strength;PROM     AROM   AROM Assessment Site Shoulder   Right/Left Shoulder Right;Left   Right Shoulder Extension 50 Degrees  Right Shoulder Flexion 145 Degrees   Right Shoulder ABduction 156 Degrees   Right Shoulder Internal Rotation 65 Degrees   Right Shoulder External Rotation 90 Degrees   Right Shoulder Horizontal ABduction 5 Degrees   Right Shoulder Horizontal  ADduction 95 Degrees   Left Shoulder Extension 42 Degrees   Left Shoulder Flexion 75 Degrees  pain in biceps   Left Shoulder ABduction 78 Degrees  pain in biceps   Left Shoulder Internal Rotation 50 Degrees   Left Shoulder External Rotation 60 Degrees   Left Shoulder Horizontal ABduction 0 Degrees   Left Shoulder Horizontal ADduction 65 Degrees     PROM   Overall PROM Comments LT shoulder  PROM WNL except hor adduction  limited to 80 degrees  all guarded  ,   able to reach to sacrum and to head     PROM Assessment Site Shoulder   Right/Left Shoulder Left     Strength   Overall Strength Comments houlder WNL but soreness on LT     Palpation   Palpation comment no significant tenderness to  palpation.             Objective measurements completed on examination: See above findings.                  PT Education - 06/17/17 1001    Education provided Yes   Education Details POC , Gentle ROM asssit from spouse at home avoid sever pain direction   Person(s) Educated Patient   Methods Explanation;Demonstration;Tactile cues;Verbal cues   Comprehension Returned demonstration;Verbalized understanding          PT Short Term Goals - 06/17/17 1024      PT SHORT TERM GOAL #1   Title She will be independent with initial HEP   Time 1   Period Weeks   Status New     PT SHORT TERM GOAL #2   Title h   Time 4   Period Weeks   Status New     PT SHORT TERM GOAL #3   Title She will report pain decreased 50% with reaching and lifting with arm   Time 4   Period Weeks   Status New     PT SHORT TERM GOAL #4   Title She will be able to lift 20 pounds or more with 1-2 max pain/    Time 4   Period Weeks   Status New           PT Long Term Goals - 06/17/17 1026      PT LONG TERM GOAL #1   Title Same as STG and will add LTG if needed  with extended PT   Time 4   Period Weeks   Status New                Plan - 06/17/17 0943    Clinical Impression Statement MS Achee reports LT shoulder pain and stiffness post flu shot, PAin is improved and ROM improved with gentle HEP but  still limited with lifting and reaching with most pain with HOR adduction and overhead.    PAin mostly in band around biceps and triceps more over biceps.    Clinical Presentation Evolving   Clinical Decision Making Moderate   Rehab Potential Good   PT Frequency 2x / week   PT Duration 3 weeks  maay need more    PT Treatment/Interventions Cryotherapy;Iontophoresis 4mg /ml Dexamethasone;Ultrasound;Moist Heat;Passive range of motion;Patient/family education;Manual techniques;Therapeutic exercise;Taping   PT  Next Visit Plan REview ROM ,  Gentle ROM . Start isometrics,  modalities   Consulted and Agree with Plan of Care Patient      Patient will benefit from skilled therapeutic intervention in order to improve the following deficits and impairments:  Pain, Decreased activity tolerance, Decreased range of motion, Increased muscle spasms, Impaired UE functional use  Visit Diagnosis: Cramp and spasm - Plan: PT plan of care cert/re-cert  Acute pain of left shoulder - Plan: PT plan of care cert/re-cert  Stiffness of left shoulder, not elsewhere classified - Plan: PT plan of care cert/re-cert     Problem List Patient Active Problem List   Diagnosis Date Noted  . Physical exam 08/08/2015  . Hyperlipidemia 07/22/2013  . Menopausal hot flushes   initiated HT 04/2013 06/27/2013  . Menopause 04/07/2013  . Allergic rhinitis 04/07/2013  . Family history of breast cancer in female 04/07/2013    Caprice Red  PT 06/17/2017, 10:29 AM  [facility] [address] [place], [place], [address] Phone: [phone]   Fax:  [phone]  Name: SHIRLIE ENCK MRN: [id number] Date of Birth: 1960/12/26

## 2017-06-20 ENCOUNTER — Ambulatory Visit: Payer: PRIVATE HEALTH INSURANCE | Attending: Family Medicine

## 2017-06-20 DIAGNOSIS — R252 Cramp and spasm: Secondary | ICD-10-CM | POA: Diagnosis present

## 2017-06-20 DIAGNOSIS — M25512 Pain in left shoulder: Secondary | ICD-10-CM | POA: Insufficient documentation

## 2017-06-20 DIAGNOSIS — M25612 Stiffness of left shoulder, not elsewhere classified: Secondary | ICD-10-CM | POA: Insufficient documentation

## 2017-06-20 NOTE — Patient Instructions (Addendum)
Strengthening: Isometric Flexion  Using wall for resistance, press right fist into ball using light pressure. Hold ____ seconds. Repeat ____ times per set. Do ____ sets per session. Do ____ sessions per day.  SHOULDER: Abduction (Isometric)  Use wall as resistance. Press arm against pillow. Keep elbow straight. Hold ___ seconds. ___ reps per set, ___ sets per day, ___ days per week  Extension (Isometric)  Place left bent elbow and back of arm against wall. Press elbow against wall. Hold ____ seconds. Repeat ____ times. Do ____ sessions per day.  Internal Rotation (Isometric)  Place palm of right fist against door frame, with elbow bent. Press fist against door frame. Hold ____ seconds. Repeat ____ times. Do ____ sessions per day.  External Rotation (Isometric)  Place back of left fist against door frame, with elbow bent. Press fist against door frame. Hold ____ seconds. Repeat ____ times. Do ____ sessions per day.  Copyright  VHI. All rights reserved.   ALL EXER 1-2 X/DAY   5-10 REPS  5-10 SEC HOLD

## 2017-06-20 NOTE — Therapy (Signed)
[facility] [address] [place], [place], [address] Phone: [phone]   Fax:  [phone]  Physical Therapy Treatment  Patient Details  Name: Katrina Taylor MRN: [id number] Date of Birth: [birth year] Referring Provider: Lu Duffelhomas Kinsley MD  Encounter Date: 06/20/2017      PT End of Session - 06/20/17 1411    Visit Number 2   Number of Visits 7   Date for PT Re-Evaluation 07/12/17   Authorization Type MC WC   PT Start Time 0215   PT Stop Time 0255   PT Time Calculation (min) 40 min   Activity Tolerance Patient tolerated treatment well   Behavior During Therapy [facility] for tasks assessed/performed      Past Medical History:  Diagnosis Date  . Weight gain     Past Surgical History:  Procedure Laterality Date  . CESAREAN SECTION      There were no vitals filed for this visit.      Subjective Assessment - 06/20/17 1414    Subjective She reports no better with tightness /pain in mid upper arm.    Currently in Pain? No/denies  with movement                         [facility] Adult PT Treatment/Exercise - 06/20/17 0001      Modalities   Modalities Moist Heat;Ultrasound     Manual Therapy   Manual Therapy Joint mobilization;Soft tissue mobilization;Manual Traction;Passive ROM   Joint Mobilization Gr 2-3 inf and post glides , distraction    Soft tissue mobilization To pectorals and deltoid   Passive ROM gentle ROM all planes      Isometrics LT shoulder in supine and standing and issued for home          PT Education - 06/20/17 1454    Education provided Yes   Education Details isometrics   Person(s) Educated Patient   Methods Explanation;Demonstration;Tactile cues;Verbal cues;Handout   Comprehension Returned demonstration;Verbalized understanding          PT Short Term Goals - 06/17/17 1024      PT SHORT TERM GOAL #1   Title She will be independent with initial HEP   Time 1   Period  Weeks   Status New     PT SHORT TERM GOAL #2   Title h   Time 4   Period Weeks   Status New     PT SHORT TERM GOAL #3   Title She will report pain decreased 50% with reaching and lifting with arm   Time 4   Period Weeks   Status New     PT SHORT TERM GOAL #4   Title She will be able to lift 20 pounds or more with 1-2 max pain/    Time 4   Period Weeks   Status New           PT Long Term Goals - 06/17/17 1026      PT LONG TERM GOAL #1   Title Same as STG and will add LTG if needed  with extended PT   Time 4   Period Weeks   Status New               Plan - 06/20/17 1411    Clinical Impression Statement No increased pain noted.  Eased pressure with isometrics ER/Flex/Abduciton.    Did them correctly. She was not dressed for US /STW to shoulder so delayed  to next visit. MD note not signed so no ionto today.    PT Treatment/Interventions Cryotherapy;Iontophoresis 4mg /ml Dexamethasone;Ultrasound;Moist Heat;Passive range of motion;Patient/family education;Manual techniques;Therapeutic exercise;Taping   PT Next Visit Plan  ,  Gentle ROM . review  isometrics, modalities, manual    PT Home Exercise Plan isometrics   Consulted and Agree with Plan of Care Patient      Patient will benefit from skilled therapeutic intervention in order to improve the following deficits and impairments:  Pain, Decreased activity tolerance, Decreased range of motion, Increased muscle spasms, Impaired UE functional use  Visit Diagnosis: Cramp and spasm  Acute pain of left shoulder  Stiffness of left shoulder, not elsewhere classified     Problem List Patient Active Problem List   Diagnosis Date Noted  . Physical exam 08/08/2015  . Hyperlipidemia 07/22/2013  . Menopausal hot flushes   initiated HT 04/2013 06/27/2013  . Menopause 04/07/2013  . Allergic rhinitis 04/07/2013  . Family history of breast cancer in female 04/07/2013    Caprice Red  PT 06/20/2017, 3:00  PM  [facility] [address] [place], [place], [address] Phone: [phone]   Fax:  [phone]  Name: Katrina Taylor MRN: [id number] Date of Birth: 1961/07/30

## 2017-06-24 ENCOUNTER — Other Ambulatory Visit: Payer: Self-pay | Admitting: Internal Medicine

## 2017-06-24 ENCOUNTER — Ambulatory Visit: Payer: PRIVATE HEALTH INSURANCE

## 2017-06-24 DIAGNOSIS — R252 Cramp and spasm: Secondary | ICD-10-CM | POA: Diagnosis not present

## 2017-06-24 DIAGNOSIS — M25612 Stiffness of left shoulder, not elsewhere classified: Secondary | ICD-10-CM

## 2017-06-24 DIAGNOSIS — Z1231 Encounter for screening mammogram for malignant neoplasm of breast: Secondary | ICD-10-CM

## 2017-06-24 DIAGNOSIS — M25512 Pain in left shoulder: Secondary | ICD-10-CM

## 2017-06-24 NOTE — Therapy (Signed)
[facility] [address] [place], [place], [address] Phone: [phone]   Fax:  [phone]  Physical Therapy Treatment  Patient Details  Name: Katrina Taylor MRN: [id number] Date of Birth: [birth year] Referring Provider: Lu Duffelhomas Kinsley MD   Encounter Date: 06/24/2017  PT End of Session - 06/24/17 0836    Visit Number  3    Number of Visits  7    Date for PT Re-Evaluation  07/12/17    Authorization Type  MC WC    PT Start Time  0803    PT Stop Time  0833    PT Time Calculation (min)  30 min    Activity Tolerance  Patient tolerated treatment well;Patient limited by pain    Behavior During Therapy  [facility] for tasks assessed/performed       Past Medical History:  Diagnosis Date  . Weight gain     Past Surgical History:  Procedure Laterality Date  . CESAREAN SECTION      There were no vitals filed for this visit.  Subjective Assessment - 06/24/17 0803    Subjective  Helped mother this weekend and shoulder hurts.    Currently in Pain?  Yes    Pain Score  6  moving arm    moving arm    Pain Orientation  Left    Pain Frequency  Intermittent    Aggravating Factors   using LT arm                      [facility] Adult PT Treatment/Exercise - 06/24/17 0001      Ultrasound   Ultrasound Location  LT shoulder     Ultrasound Parameters  100% !MHz 1.5 Wcm2    Ultrasound Goals  Pain      Manual Therapy   Manual Therapy  Taping    Soft tissue mobilization  To pectorals and deltoid, supraspoinatus , biceps tnedon    Kinesiotex  Inhibit Muscle      Kinesiotix   Inhibit Muscle   y to ant/post deltoid, 1 across acromium  with 50% pull               PT Short Term Goals - 06/17/17 1024      PT SHORT TERM GOAL #1   Title  She will be independent with initial HEP    Time  1    Period  Weeks    Status  New      PT SHORT TERM GOAL #2   Title  h    Time  4    Period  Weeks    Status  New      PT  SHORT TERM GOAL #3   Title  She will report pain decreased 50% with reaching and lifting with arm    Time  4    Period  Weeks    Status  New      PT SHORT TERM GOAL #4   Title  She will be able to lift 20 pounds or more with 1-2 max pain/     Time  4    Period  Weeks    Status  New        PT Long Term Goals - 06/17/17 1026      PT LONG TERM GOAL #1   Title  Same as STG and will add LTG if needed  with extended PT    Time  4    Period  Weeks    Status  New            Plan - 06/24/17 0837    Clinical Impression Statement  Increased pain for no apparent reason . She continues to do Her HEp  pe her report and is able to push slightly harder. She was not abel to move arm today without pain even in lower ROM.   cautioned about tape removal.   Ionto order not signed    PT Treatment/Interventions  Cryotherapy;Iontophoresis 4mg /ml Dexamethasone;Ultrasound;Moist Heat;Passive range of motion;Patient/family education;Manual techniques;Therapeutic exercise;Taping    PT Next Visit Plan   ,  Gentle ROM . review  isometrics, modalities, manual     PT Home Exercise Plan  isometrics    Consulted and Agree with Plan of Care  Patient       Patient will benefit from skilled therapeutic intervention in order to improve the following deficits and impairments:  Pain, Decreased activity tolerance, Decreased range of motion, Increased muscle spasms, Impaired UE functional use  Visit Diagnosis: Acute pain of left shoulder  Stiffness of left shoulder, not elsewhere classified     Problem List Patient Active Problem List   Diagnosis Date Noted  . Physical exam 08/08/2015  . Hyperlipidemia 07/22/2013  . Menopausal hot flushes   initiated HT 04/2013 06/27/2013  . Menopause 04/07/2013  . Allergic rhinitis 04/07/2013  . Family history of breast cancer in female 04/07/2013    Katrina Taylor  PT 06/24/2017, 8:39 AM  [facility] [address] [place], [place], [address] Phone: [phone]   Fax:  [phone]  Name: Katrina Taylor MRN: [id number] Date of Birth: 18-Oct-1960

## 2017-06-27 ENCOUNTER — Ambulatory Visit: Payer: PRIVATE HEALTH INSURANCE | Attending: Family Medicine

## 2017-06-27 DIAGNOSIS — M25612 Stiffness of left shoulder, not elsewhere classified: Secondary | ICD-10-CM | POA: Diagnosis present

## 2017-06-27 DIAGNOSIS — R252 Cramp and spasm: Secondary | ICD-10-CM | POA: Insufficient documentation

## 2017-06-27 DIAGNOSIS — M25512 Pain in left shoulder: Secondary | ICD-10-CM | POA: Insufficient documentation

## 2017-06-27 NOTE — Patient Instructions (Signed)
Issued yellow band and instructed in Rockwood exercises standing  1x/day 5-20 reps progress as comfortable

## 2017-06-27 NOTE — Therapy (Addendum)
[facility] [address] [place], [place], [address] Phone: [phone]   Fax:  [phone]  Physical Therapy Treatment  Patient Details  Name: Katrina PerfectCynthia M Taylor MRN: [id number] Date of Birth: May 07, 1961 Referring Provider: Lu Duffelhomas Kinsley MD   Encounter Date: 06/27/2017  PT End of Session - 06/27/17 1516    Visit Number  4    Number of Visits  7    Date for PT Re-Evaluation  07/12/17    Authorization Type  [facility] WC    PT Start Time  0130    PT Stop Time  0218    PT Time Calculation (min)  48 min    Activity Tolerance  Patient tolerated treatment well;Patient limited by pain    Behavior During Therapy  [facility] for tasks assessed/performed       Past Medical History:  Diagnosis Date  . Weight gain     Past Surgical History:  Procedure Laterality Date  . CESAREAN SECTION      There were no vitals filed for this visit.  Subjective Assessment - 06/27/17 1524    Subjective  Feels better today but was painful yseterday..  Can move arm more . Tape appeared to help, wore until yesterday    Pain Score  5  moving arm      Pain Location  Shoulder    Pain Orientation  Left    Pain Type  Acute pain    Pain Onset  1 to 4 weeks ago    Pain Frequency  Intermittent    Aggravating Factors   using arm    Pain Relieving Factors  heat , cold , meds         [facility] PT Assessment - 06/27/17 0001      AROM   Left Shoulder Flexion  122 Degrees    Left Shoulder ABduction  146 Degrees    Left Shoulder Internal Rotation  50 Degrees    Left Shoulder External Rotation  68 Degrees    Left Shoulder Horizontal ABduction  5 Degrees    Left Shoulder Horizontal ADduction  87 Degrees                  [facility] Adult PT Treatment/Exercise - 06/27/17 0001      Shoulder Exercises: Standing   Other Standing Exercises  rockwood yellow x 5 reps with mild ache /throb with flexion.       Shoulder Exercises: Pulleys   Flexion  -- 10 reps      Ultrasound   Ultrasound Location  Lt shoulder    Ultrasound Parameters  100% 1MHz 1.5 Wcm2    Ultrasound Goals  Pain      Manual Therapy   Soft tissue mobilization  To pectorals and deltoid, supraspoinatus , biceps tnedon    Kinesiotex  Inhibit Muscle      Kinesiotix   Inhibit Muscle   y to ant/post deltoid, 1 across acromium  with 50% pull             PT Education - 06/27/17 1523    Education provided  Yes    Education Details  Rockwood    Person(s) Educated  Patient    Methods  Explanation;Demonstration;Tactile cues;Verbal cues    Comprehension  Returned demonstration;Verbalized understanding       PT Short Term Goals - 06/27/17 1518      PT SHORT TERM GOAL #1   Title  She will be independent with initial HEP  Status  Achieved      PT SHORT TERM GOAL #3   Title  She will report pain decreased 50% with reaching and lifting with arm    Baseline  varies but improving    Status  On-going      PT SHORT TERM GOAL #4   Title  She will be able to lift 20 pounds or more with 1-2 max pain/     Status  Unable to assess        PT Long Term Goals - 06/17/17 1026      PT LONG TERM GOAL #1   Title  Same as STG and will add LTG if needed  with extended PT    Time  4    Period  Weeks    Status  New            Plan - 06/27/17 1517    Clinical Impression Statement  Pain improved today and AROM also improved from eval She was less sensitive with US and STW.  Still with impingment signs. Progressing    PT Treatment/Interventions  Cryotherapy;Iontophoresis 4mg /ml Dexamethasone;Ultrasound;Moist Heat;Passive range of motion;Patient/family education;Manual techniques;Therapeutic exercise;Taping    PT Next Visit Plan   ,  Gentle ROM . review  isometrics/ rockwood, modalities, manual     PT Home Exercise Plan  isometrics, yellow band rockwood    Consulted and Agree with Plan of Care  Patient       Patient will benefit from skilled therapeutic intervention in order to  improve the following deficits and impairments:  Pain, Decreased activity tolerance, Decreased range of motion, Increased muscle spasms, Impaired UE functional use  Visit Diagnosis: Acute pain of left shoulder  Stiffness of left shoulder, not elsewhere classified  Cramp and spasm     Problem List Patient Active Problem List   Diagnosis Date Noted  . Physical exam 08/08/2015  . Hyperlipidemia 07/22/2013  . Menopausal hot flushes   initiated HT 04/2013 06/27/2013  . Menopause 04/07/2013  . Allergic rhinitis 04/07/2013  . Family history of breast cancer in female 04/07/2013    Caprice RedChasse, Latreshia Beauchaine M  PT 06/27/2017, 3:26 PM  [facility] [address] [place], [place], [address] Phone: [phone]   Fax:  [phone]  Name: Katrina PerfectCynthia M Taylor MRN: [id number] Date of Birth: Nov 05, 1960

## 2017-07-01 ENCOUNTER — Ambulatory Visit: Payer: PRIVATE HEALTH INSURANCE | Attending: Family Medicine

## 2017-07-01 DIAGNOSIS — R252 Cramp and spasm: Secondary | ICD-10-CM | POA: Diagnosis present

## 2017-07-01 DIAGNOSIS — M25512 Pain in left shoulder: Secondary | ICD-10-CM | POA: Diagnosis present

## 2017-07-01 DIAGNOSIS — M25612 Stiffness of left shoulder, not elsewhere classified: Secondary | ICD-10-CM | POA: Insufficient documentation

## 2017-07-01 NOTE — Therapy (Signed)
[facility] [address] [place], [place], [address] Phone: [phone]   Fax:  [phone]  Physical Therapy Treatment  Patient Details  Name: Katrina Taylor MRN: [id number] Date of Birth: Jun 13, 1961 Referring Provider: Lu Duffelhomas Kinsley MD   Encounter Date: 07/01/2017  PT End of Session - 07/01/17 0801    Visit Number  5    Number of Visits  7    Date for PT Re-Evaluation  07/12/17    Authorization Type  [facility]    PT Start Time  0800    PT Stop Time  0852    PT Time Calculation (min)  52 min    Activity Tolerance  Patient tolerated treatment well;Patient limited by pain    Behavior During Therapy  [facility] for tasks assessed/performed       Past Medical History:  Diagnosis Date  . Weight gain     Past Surgical History:  Procedure Laterality Date  . CESAREAN SECTION      There were no vitals filed for this visit.  Subjective Assessment - 07/01/17 0804    Subjective  Arm pain worse today.  Saw MD last Teusday.  MD said he had done what he can and has signed all paperwork  (Pended)     Currently in Pain?  Yes  (Pended)     Pain Score  5   (Pended)  on moving arm    Pain Location  Shoulder  (Pended)     Pain Orientation  Left  (Pended)     Pain Type  Acute pain  (Pended)     Pain Onset  More than a month ago  (Pended)     Pain Frequency  Intermittent  (Pended)     Aggravating Factors   using arm  (Pended)     Pain Relieving Factors  meds , heat , cold  (Pended)     Multiple Pain Sites  No  (Pended)                       [facility] Adult PT Treatment/Exercise - 07/01/17 0001      Shoulder Exercises: Standing   Other Standing Exercises  rockwood red  x 5 reps with mild ache /throb with flexion. Spent time modifying position and distance with pull.         Moist Heat Therapy   Number Minutes Moist Heat  12 Minutes    Moist Heat Location  Shoulder      Ultrasound   Ultrasound Location  LT shoulder     Ultrasound Parameters  100% 1Mhz,      Ultrasound Goals  Pain      Manual Therapy   Soft tissue mobilization  To pectorals and deltoid, supraspoinatus , biceps tnedon             PT Education - 07/01/17 0851    Education provided  Yes    Education Details  possible causes of pain and progression of exercise and how to monitor pain to know when to stop exercise    Person(s) Educated  Patient    Methods  Explanation    Comprehension  Verbalized understanding       PT Short Term Goals - 07/01/17 0928      PT SHORT TERM GOAL #1   Title  She will be independent with initial HEP    Status  Achieved      PT SHORT TERM GOAL #3  Title  She will report pain decreased 50% with reaching and lifting with arm    Baseline  improved but sigificantly limited with reaching and lifting    Status  On-going      PT SHORT TERM GOAL #4   Title  She will be able to lift 20 pounds or more with 1-2 max pain/     Baseline  unable due to pain    Status  On-going        PT Long Term Goals - 06/17/17 1026      PT LONG TERM GOAL #1   Title  Same as STG and will add LTG if needed  with extended PT    Time  4    Period  Weeks    Status  New            Plan - 07/01/17 0925    Clinical Impression Statement  She is still improved with active motion over head with a painful arc and most pain with ER stretch and resistance. Still less sensitive with STW.  May need to avoid ER with band but she will see how this goes next 2 days and will stopp if continued irritatiuon.       PT Treatment/Interventions  Cryotherapy;Iontophoresis 4mg /ml Dexamethasone;Ultrasound;Moist Heat;Passive range of motion;Patient/family education;Manual techniques;Therapeutic exercise;Taping    PT Next Visit Plan   ,  Gentle ROM . review   rockwood with green band, modalities, manual     PT Home Exercise Plan  isometrics, yellow band rockwood    Consulted and Agree with Plan of Care  Patient       Patient will  benefit from skilled therapeutic intervention in order to improve the following deficits and impairments:  Pain, Decreased activity tolerance, Decreased range of motion, Increased muscle spasms, Impaired UE functional use  Visit Diagnosis: Acute pain of left shoulder  Cramp and spasm  Stiffness of left shoulder, not elsewhere classified     Problem List Patient Active Problem List   Diagnosis Date Noted  . Physical exam 08/08/2015  . Hyperlipidemia 07/22/2013  . Menopausal hot flushes   initiated HT 04/2013 06/27/2013  . Menopause 04/07/2013  . Allergic rhinitis 04/07/2013  . Family history of breast cancer in female 04/07/2013    Caprice RedChasse, Georges Victorio M  PT 07/01/2017, 9:32 AM  [facility] [address] [place], [place], [address] Phone: [phone]   Fax:  [phone]  Name: Katrina Taylor MRN: [id number] Date of Birth: 01-Aug-1961

## 2017-07-02 MED FILL — PROGESTERONE 100 MG CAPSULE: 100 | 90 days supply | Qty: 90 | Fill #2

## 2017-07-04 ENCOUNTER — Ambulatory Visit: Payer: PRIVATE HEALTH INSURANCE | Attending: Family Medicine

## 2017-07-04 DIAGNOSIS — R252 Cramp and spasm: Secondary | ICD-10-CM | POA: Diagnosis present

## 2017-07-04 DIAGNOSIS — M25612 Stiffness of left shoulder, not elsewhere classified: Secondary | ICD-10-CM | POA: Insufficient documentation

## 2017-07-04 DIAGNOSIS — M25512 Pain in left shoulder: Secondary | ICD-10-CM | POA: Diagnosis present

## 2017-07-04 NOTE — Patient Instructions (Signed)

## 2017-07-04 NOTE — Therapy (Signed)
[facility] [address] [place], [place], [address] Phone: [phone]   Fax:  [phone]  Physical Therapy Treatment  Patient Details  Name: Katrina PerfectCynthia M Fifer MRN: [id number] Date of Birth: 24-Jun-1961 Referring Provider: Lu Duffelhomas Kinsley MD   Encounter Date: 07/04/2017  PT End of Session - 07/04/17 1328    Visit Number  6    Date for PT Re-Evaluation  07/12/17    Authorization Type  [facility]    PT Start Time  0128    PT Stop Time  0212    PT Time Calculation (min)  44 min    Activity Tolerance  Patient tolerated treatment well;Patient limited by pain    Behavior During Therapy  [facility] for tasks assessed/performed       Past Medical History:  Diagnosis Date  . Weight gain     Past Surgical History:  Procedure Laterality Date  . CESAREAN SECTION      There were no vitals filed for this visit.  Subjective Assessment - 07/04/17 1329    Subjective  11/2 days good post last visit.     Currently in Pain?  No/denies         [facility] PT Assessment - 07/04/17 0001      AROM   Left Shoulder Flexion  127 Degrees    Left Shoulder ABduction  148 Degrees    Left Shoulder Internal Rotation  50 Degrees    Left Shoulder External Rotation  70 Degrees still quite painful    Left Shoulder Horizontal ABduction  5 Degrees    Left Shoulder Horizontal ADduction  90 Degrees                  [facility] Adult PT Treatment/Exercise - 07/04/17 0001      Modalities   Modalities  Iontophoresis      Moist Heat Therapy   Number Minutes Moist Heat  12 Minutes      Ultrasound   Ultrasound Location  LT shoulder    Ultrasound Parameters  100% 1 Mhz 1.2 Wcm2    Ultrasound Goals  Pain      Iontophoresis   Type of Iontophoresis  Dexamethasone    Location  L shoulder    Dose  1cc    Time  4-6 hours      Manual Therapy   Soft tissue mobilization  To pectorals and deltoid, supraspoinatus , biceps tnedon             PT  Education - 07/04/17 1403    Education provided  Yes    Education Details  ionto patch management    Person(s) Educated  Patient    Methods  Explanation    Comprehension  Verbalized understanding       PT Short Term Goals - 07/04/17 1407      PT SHORT TERM GOAL #1   Title  She will be independent with initial HEP    Status  Achieved      PT SHORT TERM GOAL #3   Title  She will report pain decreased 50% with reaching and lifting with arm    Baseline  improved but sigificantly limited with reaching and lifting    Status  On-going      PT SHORT TERM GOAL #4   Title  She will be able to lift 20 pounds or more with 1-2 max pain/     Baseline  unable due to pain. tolerating red band more now  Status  On-going        PT Long Term Goals - 06/17/17 1026      PT LONG TERM GOAL #1   Title  Same as STG and will add LTG if needed  with extended PT    Time  4    Period  Weeks    Status  New            Plan - 07/04/17 1328    Clinical Impression Statement  She appears improved with better quality movment with band red exercise and a day or more of decreased pain. She had no pain post band exercises today but is still cautious.  I believe she will continue to make progress with PT       Clinical Presentation  Evolving    PT Frequency  2x / week    PT Duration  3 weeks after visit 7 of authorization    PT Treatment/Interventions  Cryotherapy;Iontophoresis 4mg /ml Dexamethasone;Ultrasound;Moist Heat;Passive range of motion;Patient/family education;Manual techniques;Therapeutic exercise;Taping    PT Next Visit Plan   ,  Gentle ROM . review   rockwood with green band, modalities, manual     PT Home Exercise Plan  isometrics, red band rockwood    Consulted and Agree with Plan of Care  Patient       Patient will benefit from skilled therapeutic intervention in order to improve the following deficits and impairments:  Pain, Decreased activity tolerance, Decreased range of motion,  Increased muscle spasms, Impaired UE functional use  Visit Diagnosis: Acute pain of left shoulder - Plan: PT plan of care cert/re-cert  Cramp and spasm - Plan: PT plan of care cert/re-cert  Stiffness of left shoulder, not elsewhere classified - Plan: PT plan of care cert/re-cert     Problem List Patient Active Problem List   Diagnosis Date Noted  . Physical exam 08/08/2015  . Hyperlipidemia 07/22/2013  . Menopausal hot flushes   initiated HT 04/2013 06/27/2013  . Menopause 04/07/2013  . Allergic rhinitis 04/07/2013  . Family history of breast cancer in female 04/07/2013    Caprice RedChasse, Arly Salminen M  PT 07/04/2017, 2:14 PM  [facility] [address] [place], [place], [address] Phone: [phone]   Fax:  [phone]  Name: Katrina PerfectCynthia M Lenn MRN: [id number] Date of Birth: Feb 24, 1961

## 2017-07-17 MED FILL — ESTRADIOL 0.05 MG PATCH: 0.05 | 84 days supply | Qty: 24 | Fill #1

## 2017-07-18 ENCOUNTER — Ambulatory Visit: Payer: PRIVATE HEALTH INSURANCE | Attending: Family Medicine

## 2017-07-18 DIAGNOSIS — M25512 Pain in left shoulder: Secondary | ICD-10-CM | POA: Insufficient documentation

## 2017-07-18 DIAGNOSIS — R252 Cramp and spasm: Secondary | ICD-10-CM | POA: Insufficient documentation

## 2017-07-18 DIAGNOSIS — M25612 Stiffness of left shoulder, not elsewhere classified: Secondary | ICD-10-CM | POA: Diagnosis present

## 2017-07-18 NOTE — Therapy (Addendum)
[place], [place], [address] Phone: [phone]   Fax:  [phone]  Physical Therapy Treatment/Discharge  Patient Details  Name: Katrina Taylor MRN: [id number] Date of Birth: [birth year] Referring Provider: Carin Hock MD   Encounter Date: 07/18/2017  PT End of Session - 07/18/17 1102    Visit Number  7    Number of Visits  7    Date for PT Re-Evaluation  07/18/17    Authorization Type  [facility]    Authorization - Visit Number  7    Authorization - Number of Visits  7    PT Start Time  4854    PT Stop Time  1105    PT Time Calculation (min)  50 min    Activity Tolerance  Patient tolerated treatment well;Patient limited by pain    Behavior During Therapy  [facility] for tasks assessed/performed       Past Medical History:  Diagnosis Date  . Weight gain     Past Surgical History:  Procedure Laterality Date  . CESAREAN SECTION      There were no vitals filed for this visit.  Subjective Assessment - 07/18/17 1019    Subjective  Pain about same but new pain along bicep tendon.  AM and  late PM pain more. Across body movement still difficult. Meds same in dosage/frequency. Patch did not chang anything.    Currently in Pain?  Yes    Pain Score  2     Pain Location  Shoulder    Pain Orientation  Left    Pain Descriptors / Indicators  Spasm;Throbbing    Pain Type  Acute pain    Pain Onset  1 to 4 weeks ago    Pain Frequency  Intermittent    Aggravating Factors   using arm    Pain Relieving Factors  heat cold  , meds          [facility] PT Assessment - 07/18/17 0001      AROM   Left Shoulder Flexion  133 Degrees    Left Shoulder ABduction  147 Degrees    Left Shoulder Internal Rotation  50 Degrees    Left Shoulder External Rotation  80 Degrees    Left Shoulder Horizontal ABduction  10 Degrees    Left Shoulder Horizontal ADduction  87 Degrees                  [facility] Adult PT  Treatment/Exercise - 07/18/17 0001      Shoulder Exercises: Supine   Protraction  Left;12 reps light assist      Shoulder Exercises: Pulleys   Flexion  2 minutes      Shoulder Exercises: Isometric Strengthening   Flexion  5X10"    Extension  5X10"    External Rotation  5X10"    Internal Rotation  5X10"    ABduction  5X10"    ADduction  5X10"      Moist Heat Therapy   Number Minutes Moist Heat  12 Minutes    Moist Heat Location  Shoulder      Iontophoresis   Type of Iontophoresis  Dexamethasone    Location  L shoulder    Dose  1cc    Time  4-6 hours      Manual Therapy   Soft tissue mobilization  To pectorals and deltoid, supraspoinatus , biceps tnedon    Passive ROM  x1-2 ER/IR FLex in supine ER to  90 and IR to 80 degrees               PT Short Term Goals - 07/18/17 1106      PT SHORT TERM GOAL #1   Title  She will be independent with initial HEP    Status  Achieved      PT SHORT TERM GOAL #3   Title  She will report pain decreased 50% with reaching and lifting with arm    Status  On-going      PT SHORT TERM GOAL #4   Title  She will be able to lift 20 pounds or more with 1-2 max pain/     Baseline  unable due to pain. tolerating red band more now    Status  On-going        PT Long Term Goals - 06/17/17 1026      PT LONG TERM GOAL #1   Title  Same as STG and will add LTG if needed  with extended PT    Time  4    Period  Weeks    Status  New            Plan - 07/18/17 1103    Clinical Impression Statement  ACtive ROM improved again and PROM able to move into full rotation and almost full flexion all with incr pain at end ROM. She was able to push harder with isometrics .    She is improveing but it is slow. New pain in LT biceps tendon on anterior shoulder cause is unclesr I think she should continue with PT but needs to see MD for further assessment.      PT Treatment/Interventions  Cryotherapy;Iontophoresis 110m/ml  Dexamethasone;Ultrasound;Moist Heat;Passive range of motion;Patient/family education;Manual techniques;Therapeutic exercise;Taping    PT Next Visit Plan  Continue to progrees ROM and strength if allowed to continue PT .     PT Home Exercise Plan  isometrics, red band rockwood, gentle ROM    Consulted and Agree with Plan of Care  Patient       Patient will benefit from skilled therapeutic intervention in order to improve the following deficits and impairments:  Pain, Decreased activity tolerance, Decreased range of motion, Increased muscle spasms, Impaired UE functional use  Visit Diagnosis: Acute pain of left shoulder  Cramp and spasm  Stiffness of left shoulder, not elsewhere classified     Problem List Patient Active Problem List   Diagnosis Date Noted  . Physical exam 08/08/2015  . Hyperlipidemia 07/22/2013  . Menopausal hot flushes   initiated HT 04/2013 06/27/2013  . Menopause 04/07/2013  . Allergic rhinitis 04/07/2013  . Family history of breast cancer in female 04/07/2013    CDarrel Hoover PT 07/18/2017, 11:07 AM  [facility] [place] [address]Phone: [phone]  Fax:  [phone] Name: Katrina HAMRN: [id number] of Birth: 1June 29, 1962 PHYSICAL THERAPY DISCHARGE SUMMARY  Visits from Start of Care: 7  Current functional level related to goals / functional outcomes: See above . She was referred to Ortho MD   Remaining deficits: Unknown as she did not return   Education / Equipment: HEP  Plan:  Patient goals were partially met. Patient is being discharged due to not returning since the last visit.  ?????    Noralee Stain PT   09/20/17

## 2017-08-26 ENCOUNTER — Ambulatory Visit
Admission: RE | Admit: 2017-08-26 | Discharge: 2017-08-26 | Disposition: A | Discharge status: Home / Self Care | Payer: PRIVATE HEALTH INSURANCE | Source: Ambulatory Visit | Attending: Internal Medicine | Admitting: Internal Medicine

## 2017-08-26 DIAGNOSIS — Z1231 Encounter for screening mammogram for malignant neoplasm of breast: Secondary | ICD-10-CM

## 2017-09-03 ENCOUNTER — Other Ambulatory Visit (HOSPITAL_COMMUNITY)
Admission: RE | Admit: 2017-09-03 | Discharge: 2017-09-03 | Disposition: A | Discharge status: Home / Self Care | Payer: No Typology Code available for payment source | Source: Ambulatory Visit | Attending: Internal Medicine | Admitting: Internal Medicine

## 2017-09-03 ENCOUNTER — Other Ambulatory Visit: Payer: Self-pay | Admitting: Internal Medicine

## 2017-09-03 DIAGNOSIS — Z124 Encounter for screening for malignant neoplasm of cervix: Secondary | ICD-10-CM | POA: Insufficient documentation

## 2017-09-06 LAB — CYTOLOGY - PAP
DIAGNOSIS: NEGATIVE
HPV: NOT DETECTED

## 2017-10-23 MED FILL — PROGESTERONE 100 MG CAPSULE: 100 | 90 days supply | Qty: 90 | Fill #3

## 2017-10-23 MED FILL — ESTRADIOL 0.05 MG PATCH: 0.05 | 84 days supply | Qty: 24 | Fill #0

## 2017-11-07 MED FILL — PHENTERMINE 37.5 MG TABLET: 37.5 | 30 days supply | Qty: 30 | Fill #0

## 2018-01-15 MED FILL — PHENTERMINE 37.5 MG TABLET: 37.5 | 30 days supply | Qty: 30 | Fill #0

## 2018-01-27 MED FILL — ESTRADIOL 0.05 MG PATCH: 0.05 | 84 days supply | Qty: 24 | Fill #1

## 2018-01-27 MED FILL — PROGESTERONE 100 MG CAPSULE: 100 | 90 days supply | Qty: 90 | Fill #0

## 2018-03-11 MED FILL — PHENTERMINE 37.5 MG TABLET: 37.5 | 30 days supply | Qty: 30 | Fill #0

## 2018-03-27 MED FILL — DOXYCYCLINE HYCLATE 100 MG: 100 | 30 days supply | Qty: 60 | Fill #0

## 2018-03-27 MED FILL — CLOBETASOL 0.05% SOLUTION: 0.05 | 30 days supply | Qty: 50 | Fill #0

## 2018-03-27 MED FILL — CLINDAMYCIN PHOSP 1% LOTION: 1 | 14 days supply | Qty: 60 | Fill #0

## 2018-04-28 MED FILL — ESTRADIOL 0.05 MG PATCH: 0.05 | 84 days supply | Qty: 24 | Fill #2

## 2018-04-28 MED FILL — DOXYCYCLINE HYCLATE 100 MG: 100 | 30 days supply | Qty: 60 | Fill #1

## 2018-05-09 MED FILL — PROGESTERONE 100 MG CAPSULE: 100 | 90 days supply | Qty: 90 | Fill #0

## 2018-05-22 MED FILL — PHENTERMINE 37.5 MG TABLET: 37.5 | 30 days supply | Qty: 30 | Fill #0

## 2018-05-26 MED FILL — CLINDAMYCIN PHOSP 1% LOTION: 1 | 14 days supply | Qty: 60 | Fill #1

## 2018-05-26 MED FILL — CLOBETASOL 0.05% SOLUTION: 0.05 | 30 days supply | Qty: 50 | Fill #1

## 2018-06-11 ENCOUNTER — Other Ambulatory Visit: Payer: Self-pay | Admitting: Internal Medicine

## 2018-06-11 DIAGNOSIS — Z1231 Encounter for screening mammogram for malignant neoplasm of breast: Secondary | ICD-10-CM

## 2018-08-08 MED FILL — PROGESTERONE 100 MG CAPSULE: 100 | 90 days supply | Qty: 90 | Fill #0

## 2018-08-08 MED FILL — PHENTERMINE 37.5 MG TABLET: 37.5 | 30 days supply | Qty: 30 | Fill #0

## 2018-08-29 ENCOUNTER — Ambulatory Visit: Payer: Self-pay

## 2018-09-04 ENCOUNTER — Other Ambulatory Visit: Payer: Self-pay | Admitting: Internal Medicine

## 2018-09-04 ENCOUNTER — Ambulatory Visit
Admission: RE | Admit: 2018-09-04 | Discharge: 2018-09-04 | Disposition: A | Discharge status: Home / Self Care | Payer: No Typology Code available for payment source | Source: Ambulatory Visit | Attending: Internal Medicine | Admitting: Internal Medicine

## 2018-09-04 DIAGNOSIS — M25552 Pain in left hip: Principal | ICD-10-CM

## 2018-09-04 DIAGNOSIS — M25551 Pain in right hip: Secondary | ICD-10-CM

## 2018-09-15 ENCOUNTER — Ambulatory Visit: Payer: No Typology Code available for payment source | Admitting: Sports Medicine

## 2018-09-15 VITALS — BP 124/80 | Ht 67.5 in | Wt 170.0 lb

## 2018-09-15 DIAGNOSIS — M160 Bilateral primary osteoarthritis of hip: Secondary | ICD-10-CM | POA: Diagnosis not present

## 2018-09-15 MED ORDER — MELOXICAM 15 MG PO TABS: ORAL_TABLET | ORAL | 0 refills | Status: DC

## 2018-09-15 MED FILL — MELOXICAM 15 MG TABLET: 15 | 40 days supply | Qty: 40 | Fill #0

## 2018-09-15 NOTE — Progress Notes (Signed)
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 She states her pain has been ongoing for about 2 months. She stated she had some pain following a trip to [place] in May after walking on inclined beaches, which resolved spontaneously. She had return of pain about 2 months ago that has been progressive. She denies previous similar pain. The pain is an achey 2-10/10 pain worse with activity and improved by Ibuprofen, tylenol, Ice, heat, or rest. She states she is typically an active person and this pain has limited her activity. She had X-rays ordered by her PCP, which show bilateral osteoarthritis, right worse than left.     BP 124/80   Ht 5' 7.5" (1.715 m)   Wt 170 lb (77.1 kg)   LMP 04/07/2001   BMI 26.23 kg/m   Review of Systems: See HPI above.     Objective:  Physical Exam:  Gen: awake, alert, NAD, comfortable in exam room Pulm: breathing unlabored  Left Hip:  - Inspection: No gross deformity, no swelling, erythema, or ecchymosis - Palpation: No TTP, specifically none over greater trochanter - ROM: Limited Flexion, extension. Severely limited internal and external rotation - Strength: Normal strength. - Neuro/vasc: NV intact distally  Right Hip:  - Inspection: No gross deformity, no swelling, erythema, or ecchymosis - Palpation: No TTP, specifically none over greater trochanter - ROM: Limited Flexion, extension. Severely limited internal and external rotation - Strength: Normal strength. - Neuro/vasc: NV intact distally - External rotation of hip with walking   Assessment & Plan:   1. Bilateral Hip Osteoarthritis: Patient's imaging and exam are consistent with bilateral hip OA. She states she would prefer to avoid injections or surgery as long a possible. We will start with a conservative approach given her preference and that this is her first  occurrence of hip pain. Will start patient on scheduled Meloxicam with instruction to contact us if this does not improve her pain. She asks about hip abduction and adduction exercises, and was advised these are okay to do provided it does not hurt. - Meloxicam 15mg  daily for 7days, then PRN - Hip exercises - Follow up in 3-4 week for reevaluation  Patient seen and evaluated with the resident.  I agree with the above plan of care.  Patient's x-rays show DJD in both hips, right greater than left.  I had a long discussion with her about treatment options including oral anti-inflammatories, physical therapy, intra-articular cortisone injections, and total hip arthroplasty.  We are going to try a scheduled dose of meloxicam for 7 days and she will start some hip strengthening exercises on her own.  She will follow-up with me in 3 to 4 weeks for reevaluation.  We could consider intra-articular cortisone injections if she has no improvement with oral anti-inflammatories but definitive treatment, especially in the right hip, is a total hip arthroplasty.

## 2018-09-16 ENCOUNTER — Encounter: Payer: Self-pay | Admitting: Sports Medicine

## 2018-09-24 MED FILL — DOTTI 0.05 MG/24HR PTTW: 0.05 | 84 days supply | Qty: 24 | Fill #0 | Status: TO

## 2018-10-06 ENCOUNTER — Ambulatory Visit: Payer: Self-pay | Admitting: Sports Medicine

## 2018-10-13 ENCOUNTER — Ambulatory Visit: Payer: No Typology Code available for payment source | Admitting: Sports Medicine

## 2018-10-13 ENCOUNTER — Encounter: Payer: Self-pay | Admitting: Sports Medicine

## 2018-10-13 VITALS — BP 124/86 | Ht 67.5 in | Wt 169.0 lb

## 2018-10-13 DIAGNOSIS — M160 Bilateral primary osteoarthritis of hip: Secondary | ICD-10-CM

## 2018-10-13 NOTE — Progress Notes (Signed)
  Subjective:     Patient ID: Katrina Taylor, female   DOB: June 29, 1961, 58 y.o.   MRN: [id number]  HPI Katrina Taylor is a 58yo here for follow-up of her bilateral hip OA (R>L). She reports that after the last visit about 3 weeks ago, she took Meloxicam daily for 7 days as prescribed and it significantly helped her symptoms. She has also been doing her hip abduction exercises which she also thinks has helped. Now she says she really only has pain when the barometric pressure drops and the weather changes, and she takes the Meloxicam as needed, about every third day.   Review of Systems As above.    Objective:   Physical Exam  R hip: no erythema, swelling, or ecchymoses; tenderness to palpation over the ASIS joint, not over the greater tubercle; ROM significantly limited to about 45 degrees of flexion limited by pain, 15 degrees of internal rotation limited by pain, and 5 degrees of external rotation limited by pain; full strength with abduction, adduction, flexion, and extension; positive FADIR and positive FABER  L hip: no erythema, swelling, or ecchymoses; no tenderness to palpation over the ASIS joint or greater tubercle; ROM less limited than R but still limited to about 70 degrees of flexion limited by pain, 20 degrees of internal rotation limited by pain, and 10 degrees of external rotation limited by pain; full strength with abduction, adduction, flexion, and extension; positive FADIR and positive FABER    Assessment:     Katrina Taylor has significant bilateral hip OA (R>L) that has improved with Meloxicam and hip abduction exercises. We discussed again today the definitive therapy of hip replacement, which is not yet necessary. She will continue with Meloxicam as needed and continued PT exercises, and can follow up as needed if the pain worsens and she desires either steroid injection or to discuss a surgical option further. Instructed her to not run, limit the elliptical, and stick to non-impact exercises  such as biking and swimming.    Plan:     - Continue with Meloxicam PRN pain - Continue with hip abduction PT exercises - Avoid impact exercises - Follow-up PRN for worsening pain or function to discuss injection vs hip replacement  Marceil Welp, MS4     Patient seen and evaluated with the medical student.  I agree with the above plan of care.  Patient's symptoms have improved dramatically on meloxicam.  She is now taking it only as needed.  I encouraged her to continue with her hip strengthening exercises and avoid any sort of impact exercise such as running or jumping.  She understands that definitive treatment for her right hip is total hip arthroplasty.  She is certainly not ready to consider that yet.  She will follow-up as needed.

## 2018-11-03 MED FILL — PHENTERMINE 37.5 MG TABLET: 37.5 | 30 days supply | Qty: 30 | Fill #0

## 2018-11-10 ENCOUNTER — Ambulatory Visit: Payer: Self-pay

## 2018-11-20 MED FILL — PROGESTERONE 100 MG CAPSULE: 100 | 90 days supply | Qty: 90 | Fill #0

## 2018-12-16 MED FILL — DOTTI 0.05 MG/24HR PTTW: 0.05 | 84 days supply | Qty: 24 | Fill #0

## 2018-12-16 MED FILL — CLOBETASOL 0.05% SOLUTION: 0.05 | 30 days supply | Qty: 50 | Fill #0

## 2018-12-17 ENCOUNTER — Ambulatory Visit: Payer: Self-pay

## 2019-01-06 ENCOUNTER — Other Ambulatory Visit: Payer: Self-pay

## 2019-01-06 MED ORDER — MELOXICAM 15 MG PO TABS: ORAL_TABLET | ORAL | 0 refills | Status: DC

## 2019-01-09 MED FILL — MELOXICAM 15 MG TABLET: 15 | 40 days supply | Qty: 40 | Fill #0

## 2019-02-13 ENCOUNTER — Ambulatory Visit: Payer: No Typology Code available for payment source

## 2019-03-18 MED FILL — CLINDAMYCIN PHOSP 1% LOTION: 1 | 14 days supply | Qty: 60 | Fill #2

## 2019-03-18 MED FILL — DOXYCYCLINE HYC 100 MG CAPS: 100 | 30 days supply | Qty: 60 | Fill #2

## 2019-03-19 MED FILL — PHENTERMINE 37.5 MG TABLET: 37.5 | 30 days supply | Qty: 30 | Fill #0

## 2019-03-19 MED FILL — PROGESTERONE 100 MG CAPSULE: 100 | 90 days supply | Qty: 90 | Fill #0

## 2019-03-23 ENCOUNTER — Other Ambulatory Visit: Payer: Self-pay

## 2019-03-23 ENCOUNTER — Ambulatory Visit
Admission: RE | Admit: 2019-03-23 | Discharge: 2019-03-23 | Disposition: A | Discharge status: Home / Self Care | Payer: No Typology Code available for payment source | Source: Ambulatory Visit | Attending: Internal Medicine | Admitting: Internal Medicine

## 2019-03-23 DIAGNOSIS — Z1231 Encounter for screening mammogram for malignant neoplasm of breast: Secondary | ICD-10-CM

## 2019-03-26 MED FILL — DOTTI 0.05 MG/24HR PTTW: 0.05 | 84 days supply | Qty: 24 | Fill #0

## 2019-05-11 ENCOUNTER — Other Ambulatory Visit: Payer: Self-pay | Admitting: Sports Medicine

## 2019-05-11 MED FILL — MELOXICAM 15 MG TABLET: 15 | 40 days supply | Qty: 40 | Fill #0

## 2019-07-02 MED FILL — PROGESTERONE 100 MG CAPSULE: 100 | 90 days supply | Qty: 90 | Fill #0

## 2019-07-10 MED FILL — PROGESTERONE 100 MG CAPSULE: 100 | 90 days supply | Qty: 90 | Fill #0

## 2019-09-02 MED FILL — PHENTERMINE 37.5 MG TABLET: 37.5 | 30 days supply | Qty: 30 | Fill #0

## 2019-09-08 MED FILL — ONDANSETRON HCL 4 MG TABLET: 4 | 7 days supply | Qty: 30 | Fill #0

## 2019-09-08 MED FILL — GABAPENTIN 300 MG CAPSULE: 300 | 30 days supply | Qty: 90 | Fill #0

## 2019-09-08 MED FILL — METHOCARBAMOL 500 MG TABS: 500 | 15 days supply | Qty: 60 | Fill #0

## 2019-09-08 MED FILL — CELECOXIB 200 MG CAP: 200 | 30 days supply | Qty: 60 | Fill #0

## 2019-09-08 MED FILL — HYDROmorphone HCL 2 MG TABS: 2 | 5 days supply | Qty: 30 | Fill #0

## 2019-10-01 MED FILL — DOTTI 0.05 MG/24HR PTTW: 0.05 | 84 days supply | Qty: 24 | Fill #1

## 2019-10-15 MED FILL — CELECOXIB 200 MG CAP: 200 | 30 days supply | Qty: 60 | Fill #0

## 2019-10-15 MED FILL — METHOCARBAMOL 500 MG TABS: 500 | 15 days supply | Qty: 60 | Fill #0

## 2019-10-23 MED FILL — PROGESTERONE 100 MG CAPSULE: 100 | 90 days supply | Qty: 90 | Fill #0

## 2020-02-18 ENCOUNTER — Other Ambulatory Visit: Payer: Self-pay | Admitting: Internal Medicine

## 2020-02-18 DIAGNOSIS — Z1231 Encounter for screening mammogram for malignant neoplasm of breast: Secondary | ICD-10-CM

## 2020-03-01 MED FILL — PHENTERMINE 37.5 MG TABLET: 37.5 | 30 days supply | Qty: 30 | Fill #0

## 2020-03-23 ENCOUNTER — Other Ambulatory Visit: Payer: Self-pay

## 2020-03-23 ENCOUNTER — Ambulatory Visit
Admission: RE | Admit: 2020-03-23 | Discharge: 2020-03-23 | Disposition: A | Discharge status: Home / Self Care | Payer: No Typology Code available for payment source | Source: Ambulatory Visit | Attending: Internal Medicine | Admitting: Internal Medicine

## 2020-03-23 DIAGNOSIS — Z1231 Encounter for screening mammogram for malignant neoplasm of breast: Secondary | ICD-10-CM

## 2020-05-30 ENCOUNTER — Other Ambulatory Visit (HOSPITAL_COMMUNITY): Payer: Self-pay | Admitting: Internal Medicine

## 2020-05-30 MED FILL — DOTTI 0.05 MG/24HR PTTW: 0.05 | 84 days supply | Qty: 24 | Fill #0

## 2020-05-30 MED FILL — PROGESTERONE 100 MG CAPS: 100 | 90 days supply | Qty: 90 | Fill #0

## 2020-09-06 ENCOUNTER — Other Ambulatory Visit (HOSPITAL_COMMUNITY): Payer: Self-pay | Admitting: Internal Medicine

## 2020-09-06 MED FILL — PROGESTERONE 100 MG CAPS: 100 | 90 days supply | Qty: 90 | Fill #0

## 2020-09-06 MED FILL — DOTTI 0.05 MG/24HR PTTW: 0.05 | 84 days supply | Qty: 24 | Fill #0

## 2020-09-29 ENCOUNTER — Other Ambulatory Visit (HOSPITAL_COMMUNITY): Payer: Self-pay | Admitting: Internal Medicine

## 2020-09-29 MED FILL — SHINGRIX 50 MCG SUS: 50 | 1 days supply | Qty: 1 | Fill #0

## 2020-12-08 ENCOUNTER — Other Ambulatory Visit (HOSPITAL_COMMUNITY): Payer: Self-pay

## 2020-12-15 ENCOUNTER — Other Ambulatory Visit (HOSPITAL_COMMUNITY): Payer: Self-pay

## 2020-12-16 ENCOUNTER — Other Ambulatory Visit (HOSPITAL_COMMUNITY): Payer: Self-pay

## 2020-12-17 ENCOUNTER — Other Ambulatory Visit (HOSPITAL_COMMUNITY): Payer: Self-pay

## 2020-12-20 ENCOUNTER — Other Ambulatory Visit (HOSPITAL_COMMUNITY): Payer: Self-pay

## 2020-12-21 ENCOUNTER — Other Ambulatory Visit (HOSPITAL_COMMUNITY): Payer: Self-pay

## 2020-12-21 MED ORDER — PROGESTERONE MICRONIZED 100 MG PO CAPS
100.0000 mg | ORAL_CAPSULE | Freq: Every day | ORAL | 0 refills | Status: DC
  Filled 2020-12-21: qty 30, 30d supply, fill #0

## 2020-12-22 ENCOUNTER — Other Ambulatory Visit (HOSPITAL_COMMUNITY): Payer: Self-pay

## 2020-12-23 ENCOUNTER — Other Ambulatory Visit (HOSPITAL_COMMUNITY): Payer: Self-pay

## 2020-12-23 MED FILL — Zoster Vac Recombinant Adjuvanted for IM Inj 50 MCG/0.5ML: INTRAMUSCULAR | 1 days supply | Qty: 1 | Fill #0 | Status: AC

## 2021-01-04 ENCOUNTER — Other Ambulatory Visit (HOSPITAL_COMMUNITY): Payer: Self-pay

## 2021-01-26 ENCOUNTER — Other Ambulatory Visit (HOSPITAL_COMMUNITY): Payer: Self-pay

## 2021-01-26 MED ORDER — PROGESTERONE MICRONIZED 100 MG PO CAPS
100.0000 mg | ORAL_CAPSULE | Freq: Every day | ORAL | 0 refills | Status: DC
  Filled 2021-01-26: qty 30, 30d supply, fill #0

## 2021-01-27 ENCOUNTER — Other Ambulatory Visit (HOSPITAL_COMMUNITY): Payer: Self-pay

## 2021-01-31 ENCOUNTER — Other Ambulatory Visit (HOSPITAL_COMMUNITY): Payer: Self-pay

## 2021-02-01 ENCOUNTER — Other Ambulatory Visit (HOSPITAL_COMMUNITY): Payer: Self-pay

## 2021-02-01 MED ORDER — ESTRADIOL 0.05 MG/24HR TD PTTW
1.0000 | MEDICATED_PATCH | TRANSDERMAL | 0 refills | Status: DC
  Filled 2021-02-01: qty 24, 84d supply, fill #0

## 2021-02-06 ENCOUNTER — Other Ambulatory Visit (HOSPITAL_COMMUNITY): Payer: Self-pay

## 2021-02-07 ENCOUNTER — Other Ambulatory Visit: Payer: Self-pay | Admitting: Internal Medicine

## 2021-02-07 DIAGNOSIS — Z1231 Encounter for screening mammogram for malignant neoplasm of breast: Secondary | ICD-10-CM

## 2021-02-27 ENCOUNTER — Other Ambulatory Visit (HOSPITAL_COMMUNITY): Payer: Self-pay

## 2021-02-27 MED ORDER — PROGESTERONE MICRONIZED 100 MG PO CAPS
100.0000 mg | ORAL_CAPSULE | Freq: Every day | ORAL | 5 refills | Status: DC
  Filled 2021-02-27: qty 30, 30d supply, fill #0
  Filled 2021-04-05: qty 30, 30d supply, fill #1
  Filled 2021-05-09: qty 30, 30d supply, fill #2
  Filled 2021-06-12: qty 30, 30d supply, fill #3
  Filled 2021-07-25: qty 30, 30d supply, fill #4
  Filled 2021-08-25: qty 30, 30d supply, fill #5

## 2021-04-04 ENCOUNTER — Other Ambulatory Visit: Payer: Self-pay

## 2021-04-04 ENCOUNTER — Ambulatory Visit
Admission: RE | Admit: 2021-04-04 | Discharge: 2021-04-04 | Disposition: A | Discharge status: Home / Self Care | Payer: No Typology Code available for payment source | Source: Ambulatory Visit | Attending: Internal Medicine | Admitting: Internal Medicine

## 2021-04-04 DIAGNOSIS — Z1231 Encounter for screening mammogram for malignant neoplasm of breast: Secondary | ICD-10-CM

## 2021-04-05 ENCOUNTER — Other Ambulatory Visit (HOSPITAL_COMMUNITY): Payer: Self-pay

## 2021-05-09 ENCOUNTER — Other Ambulatory Visit (HOSPITAL_COMMUNITY): Payer: Self-pay

## 2021-06-12 ENCOUNTER — Other Ambulatory Visit (HOSPITAL_COMMUNITY): Payer: Self-pay

## 2021-06-20 ENCOUNTER — Other Ambulatory Visit (HOSPITAL_COMMUNITY): Payer: Self-pay

## 2021-06-20 MED ORDER — ESTRADIOL 0.05 MG/24HR TD PTTW
1.0000 | MEDICATED_PATCH | TRANSDERMAL | 0 refills | Status: DC
  Filled 2021-06-20: qty 24, 84d supply, fill #0

## 2021-07-25 ENCOUNTER — Other Ambulatory Visit (HOSPITAL_COMMUNITY): Payer: Self-pay

## 2021-08-01 IMAGING — MG DIGITAL SCREENING BILATERAL MAMMOGRAM WITH IMPLANTS, CAD AND TOM
8 of 12 series · 8 of 28 positions shown · non-contrast
Comparison: Previous exam(s).

CLINICAL DATA: Screening.

EXAM:
DIGITAL SCREENING BILATERAL MAMMOGRAM WITH IMPLANTS, CAD AND TOMO
The patient has retropectoral implants. Standard and implant
displaced views were performed.

[L MLO]
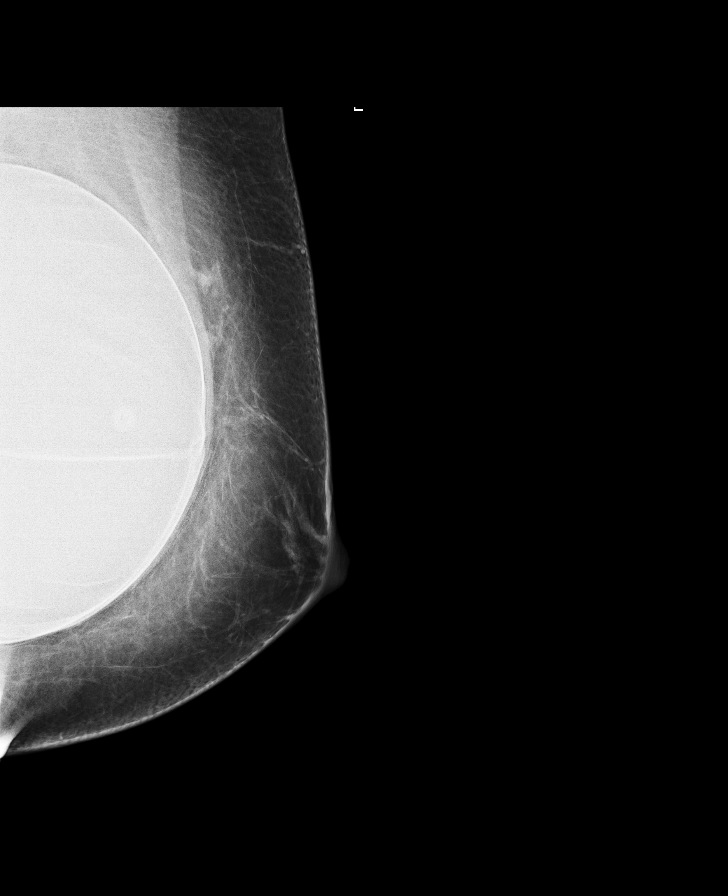

[R MLO]
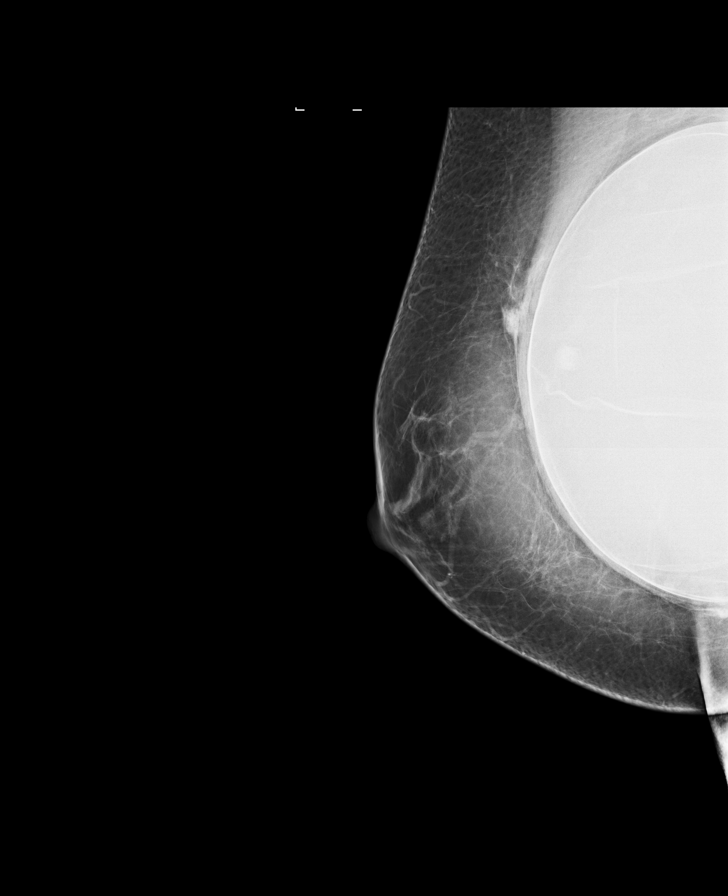

[R CC]
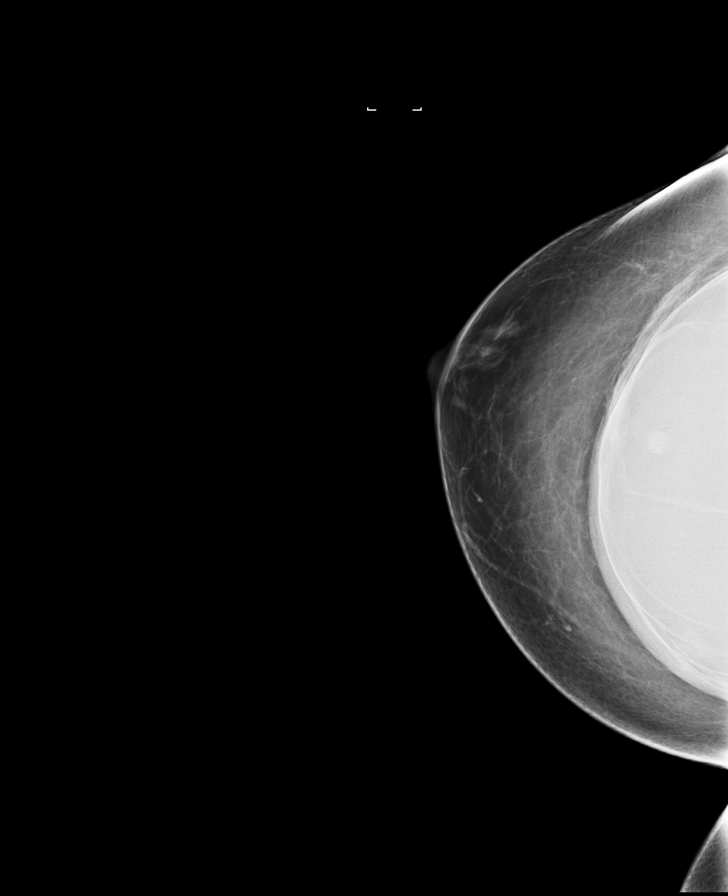

[L CC]
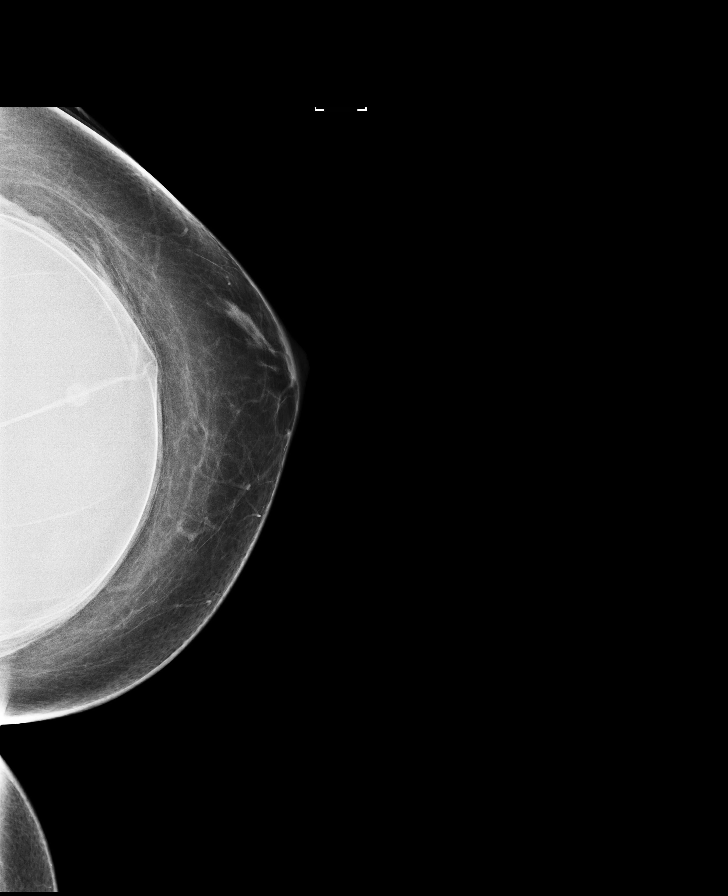

[L MLO synth-2D]
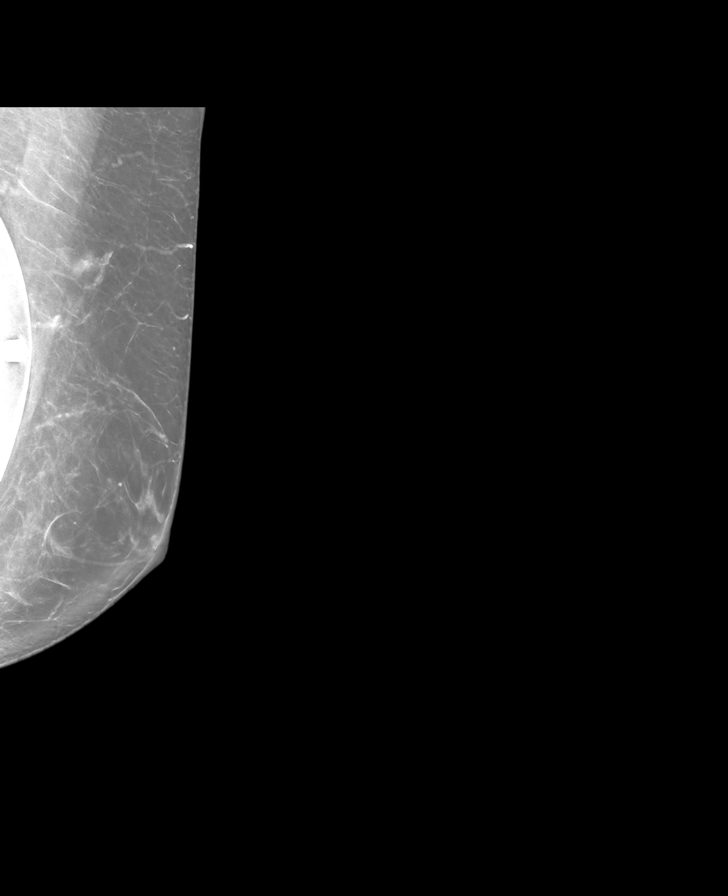

[R CC synth-2D]
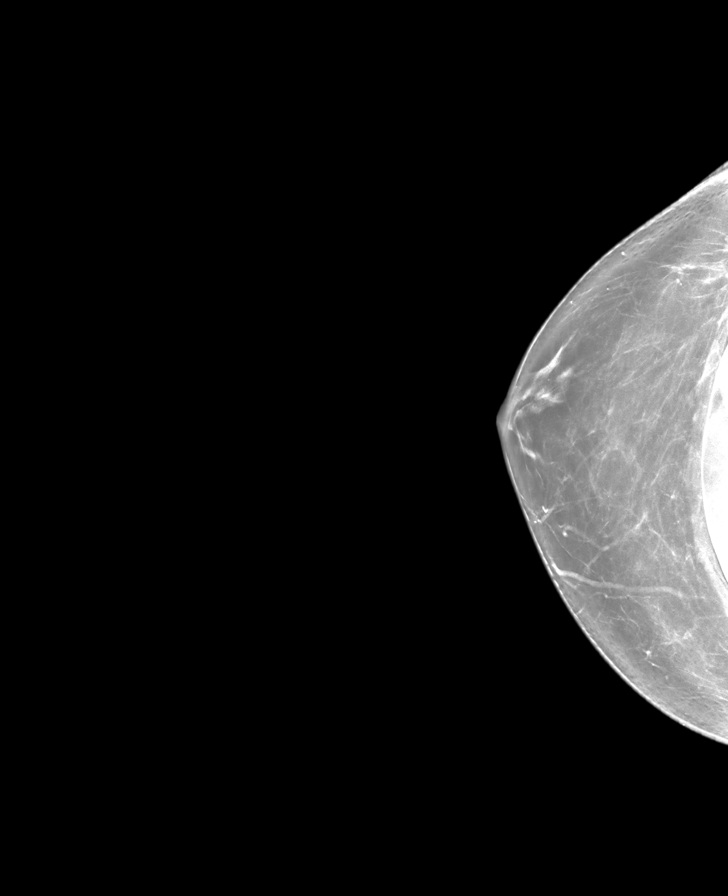

[L CC synth-2D]
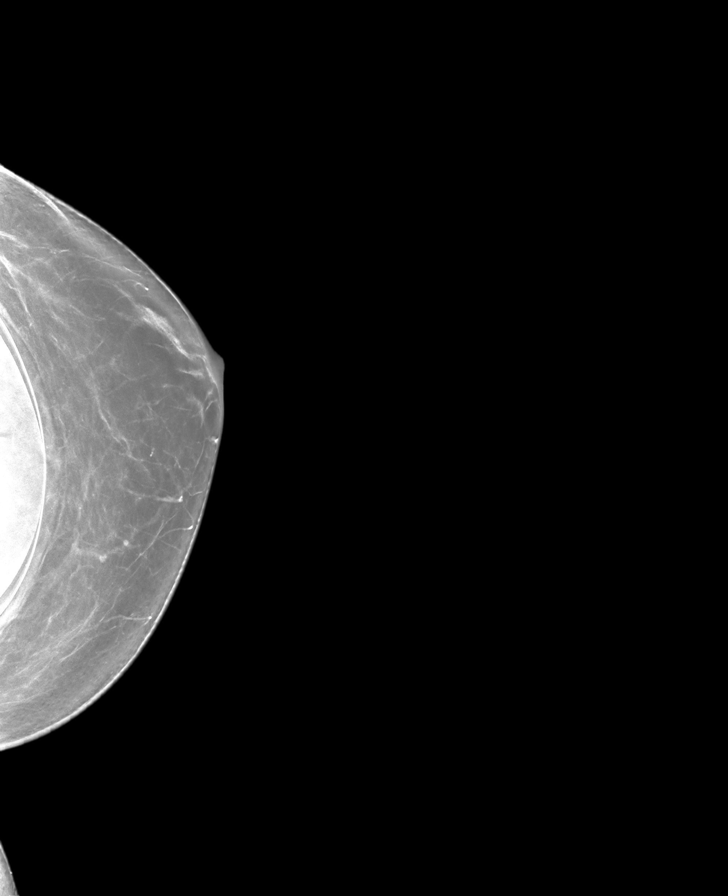

[R MLO synth-2D]
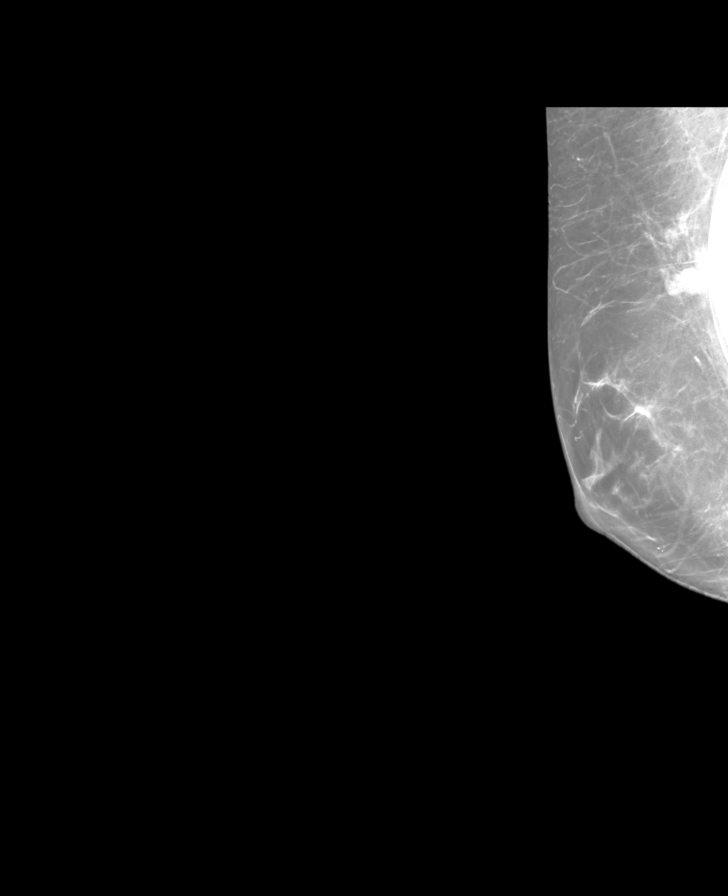

[8 of 28 positions shown; findings below may reference images not displayed]

ACR Breast Density Category b: There are scattered areas of
fibroglandular density.
FINDINGS: There are no findings suspicious for malignancy. Images were
processed with CAD.
IMPRESSION: No mammographic evidence of malignancy. A result letter of this
screening mammogram will be mailed directly to the patient.

RECOMMENDATION:
Screening mammogram in one year. (Code:60-T-8Z4)

BI-RADS CATEGORY  1:  Negative.

## 2021-08-25 ENCOUNTER — Other Ambulatory Visit (HOSPITAL_COMMUNITY): Payer: Self-pay

## 2021-10-11 ENCOUNTER — Other Ambulatory Visit (HOSPITAL_COMMUNITY): Payer: Self-pay

## 2021-10-12 ENCOUNTER — Other Ambulatory Visit (HOSPITAL_COMMUNITY): Payer: Self-pay

## 2021-10-24 ENCOUNTER — Other Ambulatory Visit (HOSPITAL_COMMUNITY): Payer: Self-pay

## 2021-10-24 MED ORDER — ESTRADIOL 0.0375 MG/24HR TD PTTW
1.0000 | MEDICATED_PATCH | TRANSDERMAL | 2 refills | Status: DC
  Filled 2021-10-24: qty 24, 84d supply, fill #0

## 2021-10-24 MED ORDER — PROGESTERONE MICRONIZED 100 MG PO CAPS
100.0000 mg | ORAL_CAPSULE | Freq: Every day | ORAL | 2 refills | Status: DC
  Filled 2021-10-24: qty 90, 90d supply, fill #0
  Filled 2022-03-19: qty 90, 90d supply, fill #1
  Filled 2022-08-07: qty 90, 90d supply, fill #2

## 2021-10-25 ENCOUNTER — Other Ambulatory Visit (HOSPITAL_COMMUNITY): Payer: Self-pay

## 2021-10-26 ENCOUNTER — Other Ambulatory Visit (HOSPITAL_COMMUNITY): Payer: Self-pay

## 2021-10-26 MED ORDER — ESTRADIOL 0.05 MG/24HR TD PTTW
1.0000 | MEDICATED_PATCH | TRANSDERMAL | 0 refills | Status: DC
  Filled 2021-10-26: qty 24, 84d supply, fill #0

## 2021-12-11 ENCOUNTER — Other Ambulatory Visit (HOSPITAL_COMMUNITY): Payer: Self-pay

## 2021-12-11 MED ORDER — OLOPATADINE HCL 0.2 % OP SOLN
1.0000 [drp] | Freq: Every day | OPHTHALMIC | 0 refills | Status: AC
  Filled 2021-12-11: qty 2.5, 25d supply, fill #0
  Filled 2022-10-29: qty 2.5, 25d supply, fill #1

## 2021-12-11 MED ORDER — HYDROXYZINE HCL 25 MG PO TABS
25.0000 mg | ORAL_TABLET | Freq: Two times a day (BID) | ORAL | 0 refills | Status: AC | PRN
  Filled 2021-12-11: qty 20, 10d supply, fill #0

## 2021-12-11 MED ORDER — PREDNISONE 10 MG (21) PO TBPK
ORAL_TABLET | ORAL | 0 refills | Status: AC
  Filled 2021-12-11: qty 21, 6d supply, fill #0

## 2022-03-19 ENCOUNTER — Other Ambulatory Visit (HOSPITAL_COMMUNITY): Payer: Self-pay

## 2022-03-20 ENCOUNTER — Other Ambulatory Visit (HOSPITAL_COMMUNITY): Payer: Self-pay

## 2022-03-20 MED ORDER — ESTRADIOL 0.05 MG/24HR TD PTTW
1.0000 | MEDICATED_PATCH | TRANSDERMAL | 0 refills | Status: DC
  Filled 2022-03-20: qty 24, 84d supply, fill #0

## 2022-07-31 ENCOUNTER — Other Ambulatory Visit: Payer: Self-pay | Admitting: Internal Medicine

## 2022-07-31 DIAGNOSIS — Z1231 Encounter for screening mammogram for malignant neoplasm of breast: Secondary | ICD-10-CM

## 2022-08-07 ENCOUNTER — Other Ambulatory Visit (HOSPITAL_COMMUNITY): Payer: Self-pay

## 2022-08-07 MED ORDER — ESTRADIOL 0.05 MG/24HR TD PTTW
1.0000 | MEDICATED_PATCH | TRANSDERMAL | 0 refills | Status: DC
  Filled 2022-08-07: qty 24, 84d supply, fill #0

## 2022-08-27 DIAGNOSIS — E785 Hyperlipidemia, unspecified: Secondary | ICD-10-CM | POA: Diagnosis not present

## 2022-08-27 DIAGNOSIS — J309 Allergic rhinitis, unspecified: Secondary | ICD-10-CM | POA: Diagnosis not present

## 2022-08-27 DIAGNOSIS — Z0000 Encounter for general adult medical examination without abnormal findings: Secondary | ICD-10-CM | POA: Diagnosis not present

## 2022-08-27 DIAGNOSIS — E559 Vitamin D deficiency, unspecified: Secondary | ICD-10-CM | POA: Diagnosis not present

## 2022-08-27 DIAGNOSIS — Z803 Family history of malignant neoplasm of breast: Secondary | ICD-10-CM | POA: Diagnosis not present

## 2022-08-27 DIAGNOSIS — N951 Menopausal and female climacteric states: Secondary | ICD-10-CM | POA: Diagnosis not present

## 2022-08-27 DIAGNOSIS — Z1231 Encounter for screening mammogram for malignant neoplasm of breast: Secondary | ICD-10-CM | POA: Diagnosis not present

## 2022-08-27 DIAGNOSIS — Z1211 Encounter for screening for malignant neoplasm of colon: Secondary | ICD-10-CM | POA: Diagnosis not present

## 2022-08-27 DIAGNOSIS — Z96643 Presence of artificial hip joint, bilateral: Secondary | ICD-10-CM | POA: Diagnosis not present

## 2022-09-24 ENCOUNTER — Ambulatory Visit
Admission: RE | Admit: 2022-09-24 | Discharge: 2022-09-24 | Disposition: A | Discharge status: Home / Self Care | Payer: 59 | Source: Ambulatory Visit | Attending: Internal Medicine | Admitting: Internal Medicine

## 2022-09-24 DIAGNOSIS — Z1231 Encounter for screening mammogram for malignant neoplasm of breast: Secondary | ICD-10-CM

## 2022-10-29 ENCOUNTER — Other Ambulatory Visit (HOSPITAL_COMMUNITY): Payer: Self-pay

## 2022-10-30 ENCOUNTER — Other Ambulatory Visit (HOSPITAL_COMMUNITY): Payer: Self-pay

## 2022-10-30 MED ORDER — PROGESTERONE MICRONIZED 100 MG PO CAPS
100.0000 mg | ORAL_CAPSULE | Freq: Every evening | ORAL | 2 refills | Status: DC
  Filled 2022-10-30: qty 90, 90d supply, fill #0
  Filled 2023-01-22: qty 90, 90d supply, fill #1
  Filled 2023-08-12: qty 90, 90d supply, fill #0

## 2022-10-30 MED ORDER — ESTRADIOL 0.05 MG/24HR TD PTTW
1.0000 | MEDICATED_PATCH | TRANSDERMAL | 0 refills | Status: AC
  Filled 2022-10-30: qty 24, 84d supply, fill #0

## 2022-11-01 ENCOUNTER — Other Ambulatory Visit: Payer: Self-pay

## 2022-11-02 ENCOUNTER — Other Ambulatory Visit (HOSPITAL_COMMUNITY): Payer: Self-pay

## 2022-11-08 ENCOUNTER — Other Ambulatory Visit: Payer: Self-pay

## 2023-01-22 ENCOUNTER — Other Ambulatory Visit (HOSPITAL_COMMUNITY): Payer: Self-pay

## 2023-01-23 ENCOUNTER — Other Ambulatory Visit: Payer: Self-pay

## 2023-01-24 ENCOUNTER — Other Ambulatory Visit (HOSPITAL_COMMUNITY): Payer: Self-pay

## 2023-01-28 ENCOUNTER — Other Ambulatory Visit (HOSPITAL_COMMUNITY): Payer: Self-pay

## 2023-03-11 DIAGNOSIS — H00024 Hordeolum internum left upper eyelid: Secondary | ICD-10-CM | POA: Diagnosis not present

## 2023-03-13 DIAGNOSIS — Z1211 Encounter for screening for malignant neoplasm of colon: Secondary | ICD-10-CM | POA: Diagnosis not present

## 2023-04-09 ENCOUNTER — Other Ambulatory Visit (HOSPITAL_COMMUNITY): Payer: Self-pay

## 2023-04-10 ENCOUNTER — Other Ambulatory Visit (HOSPITAL_COMMUNITY): Payer: Self-pay

## 2023-04-10 MED ORDER — ESTRADIOL 0.05 MG/24HR TD PTTW
1.0000 | MEDICATED_PATCH | TRANSDERMAL | 0 refills | Status: DC
  Filled 2023-04-10: qty 24, 84d supply, fill #0

## 2023-07-09 ENCOUNTER — Other Ambulatory Visit (HOSPITAL_COMMUNITY): Payer: Self-pay

## 2023-08-06 ENCOUNTER — Other Ambulatory Visit: Payer: Self-pay | Admitting: Internal Medicine

## 2023-08-06 DIAGNOSIS — Z1231 Encounter for screening mammogram for malignant neoplasm of breast: Secondary | ICD-10-CM

## 2023-08-12 ENCOUNTER — Other Ambulatory Visit (HOSPITAL_COMMUNITY): Payer: Self-pay

## 2023-08-12 MED ORDER — ESTRADIOL 0.05 MG/24HR TD PTTW
1.0000 | MEDICATED_PATCH | TRANSDERMAL | 0 refills | Status: DC
  Filled 2023-08-12: qty 24, 84d supply, fill #0

## 2023-09-30 ENCOUNTER — Ambulatory Visit
Admission: RE | Admit: 2023-09-30 | Discharge: 2023-09-30 | Disposition: A | Discharge status: Home / Self Care | Payer: Commercial Managed Care - PPO | Source: Ambulatory Visit | Attending: Internal Medicine | Admitting: Internal Medicine

## 2023-09-30 DIAGNOSIS — Z1231 Encounter for screening mammogram for malignant neoplasm of breast: Secondary | ICD-10-CM | POA: Diagnosis not present

## 2023-10-04 DIAGNOSIS — Z1231 Encounter for screening mammogram for malignant neoplasm of breast: Secondary | ICD-10-CM | POA: Diagnosis not present

## 2023-10-04 DIAGNOSIS — Z01818 Encounter for other preprocedural examination: Secondary | ICD-10-CM | POA: Diagnosis not present

## 2023-10-04 DIAGNOSIS — Z803 Family history of malignant neoplasm of breast: Secondary | ICD-10-CM | POA: Diagnosis not present

## 2023-10-04 DIAGNOSIS — J309 Allergic rhinitis, unspecified: Secondary | ICD-10-CM | POA: Diagnosis not present

## 2023-10-04 DIAGNOSIS — Z1211 Encounter for screening for malignant neoplasm of colon: Secondary | ICD-10-CM | POA: Diagnosis not present

## 2023-10-04 DIAGNOSIS — Z0000 Encounter for general adult medical examination without abnormal findings: Secondary | ICD-10-CM | POA: Diagnosis not present

## 2023-10-04 DIAGNOSIS — Z96643 Presence of artificial hip joint, bilateral: Secondary | ICD-10-CM | POA: Diagnosis not present

## 2023-10-04 DIAGNOSIS — E785 Hyperlipidemia, unspecified: Secondary | ICD-10-CM | POA: Diagnosis not present

## 2023-10-04 DIAGNOSIS — E559 Vitamin D deficiency, unspecified: Secondary | ICD-10-CM | POA: Diagnosis not present

## 2023-11-03 ENCOUNTER — Other Ambulatory Visit (HOSPITAL_COMMUNITY): Payer: Self-pay

## 2023-11-04 ENCOUNTER — Other Ambulatory Visit (HOSPITAL_COMMUNITY): Payer: Self-pay

## 2023-11-04 MED ORDER — ESTRADIOL 0.05 MG/24HR TD PTTW
1.0000 | MEDICATED_PATCH | TRANSDERMAL | 0 refills | Status: DC
  Filled 2023-11-04: qty 24, 84d supply, fill #0

## 2024-01-20 ENCOUNTER — Other Ambulatory Visit (HOSPITAL_COMMUNITY): Payer: Self-pay

## 2024-01-20 MED ORDER — PROGESTERONE MICRONIZED 100 MG PO CAPS
100.0000 mg | ORAL_CAPSULE | Freq: Every day | ORAL | 2 refills | Status: AC
  Filled 2024-01-20: qty 90, 90d supply, fill #0

## 2024-01-20 MED ORDER — ESTRADIOL 0.05 MG/24HR TD PTTW
1.0000 | MEDICATED_PATCH | TRANSDERMAL | 0 refills | Status: AC
  Filled 2024-01-20: qty 24, 84d supply, fill #0

## 2024-05-19 ENCOUNTER — Other Ambulatory Visit (HOSPITAL_COMMUNITY): Payer: Self-pay

## 2024-05-19 MED ORDER — PROGESTERONE MICRONIZED 100 MG PO CAPS
100.0000 mg | ORAL_CAPSULE | Freq: Every day | ORAL | 0 refills | Status: DC
  Filled 2024-05-19: qty 90, 90d supply, fill #0

## 2024-05-19 MED ORDER — ESTRADIOL 0.05 MG/24HR TD PTTW
1.0000 | MEDICATED_PATCH | TRANSDERMAL | 0 refills | Status: AC
  Filled 2024-05-19: qty 24, 84d supply, fill #0

## 2024-08-24 ENCOUNTER — Encounter (HOSPITAL_COMMUNITY): Payer: Self-pay

## 2024-08-24 ENCOUNTER — Other Ambulatory Visit (HOSPITAL_COMMUNITY): Payer: Self-pay

## 2024-08-24 MED ORDER — PROGESTERONE MICRONIZED 100 MG PO CAPS
100.0000 mg | ORAL_CAPSULE | Freq: Every day | ORAL | 0 refills | Status: AC
  Filled 2024-08-24: qty 90, 90d supply, fill #0
